# Patient Record
Sex: Female | Born: 1995 | Race: White | Hispanic: No | Marital: Single | Smoking: Former smoker
Health system: Southern US, Community
[De-identification: ages and names within clinical notes are randomized; demographics above are authoritative.]

## PROBLEM LIST (undated history)

## (undated) DIAGNOSIS — E282 Polycystic ovarian syndrome: Secondary | ICD-10-CM

## (undated) DIAGNOSIS — N926 Irregular menstruation, unspecified: Secondary | ICD-10-CM

## (undated) DIAGNOSIS — A6000 Herpesviral infection of urogenital system, unspecified: Secondary | ICD-10-CM

## (undated) HISTORY — DX: Polycystic ovarian syndrome: E28.2

## (undated) HISTORY — DX: Irregular menstruation, unspecified: N92.6

## (undated) HISTORY — DX: Herpesviral infection of urogenital system, unspecified: A60.00

## (undated) HISTORY — PX: TYMPANOSTOMY TUBE PLACEMENT: SHX32

---
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
## 2015-10-13 ENCOUNTER — Encounter: Payer: Self-pay | Admitting: Emergency Medicine

## 2015-10-13 ENCOUNTER — Ambulatory Visit
Admission: EM | Admit: 2015-10-13 | Discharge: 2015-10-13 | Disposition: A | Discharge status: Home / Self Care | Payer: Managed Care, Other (non HMO) | Attending: Family Medicine | Admitting: Family Medicine

## 2015-10-13 DIAGNOSIS — B2790 Infectious mononucleosis, unspecified without complication: Secondary | ICD-10-CM

## 2015-10-13 DIAGNOSIS — J029 Acute pharyngitis, unspecified: Secondary | ICD-10-CM | POA: Diagnosis not present

## 2015-10-13 LAB — RAPID STREP SCREEN (MED CTR MEBANE ONLY): STREPTOCOCCUS, GROUP A SCREEN (DIRECT): NEGATIVE

## 2015-10-13 LAB — MONONUCLEOSIS SCREEN: MONO SCREEN: POSITIVE — AB

## 2015-10-13 NOTE — ED Notes (Signed)
Patient c/o sore throat since Saturday.  Patient denies fevers.  

## 2015-10-13 NOTE — Discharge Instructions (Signed)
Infectious Mononucleosis °Infectious mononucleosis is an infection caused by a virus. This illness is often called "mono." It causes symptoms that affect various areas of the body, including the throat, upper air passages, and lymph glands. The liver or spleen may also be affected. °The virus spreads from person to person through close contact. The illness is usually not serious and often goes away in 2-4 weeks without treatment. In rare cases, symptoms can be more severe and last longer, sometimes up to several months. Because the illness can sometimes cause the liver or spleen to become enlarged, you should not participate in contact sports or strenuous exercise until your health care provider approves. °CAUSES  °Infectious mononucleosis is caused by the Epstein-Barr virus. This virus spreads through contact with an infected person's saliva or other bodily fluids. It is often spread through kissing. It may also spread through coughing or sharing utensils or drinking glasses that were recently used by an infected person. An infected person will not always appear ill but can still spread the virus. °RISK FACTORS °This illness is most common in adolescents and young adults. °SIGNS AND SYMPTOMS  °The most common symptoms of infectious mononucleosis are: °· Sore throat.   °· Headache.   °· Fatigue.   °· Muscle aches.   °· Swollen glands.   °· Fever.   °· Poor appetite.   °· Enlarged liver or spleen.   °Some less common symptoms that can also occur include: °· Rash. This is more common if you take antibiotic medicines. °· Feeling sick to your stomach (nauseous).   °· Abdominal pain.   °DIAGNOSIS  °Your health care provider will take your medical history and do a physical exam. Blood tests can be done to confirm the diagnosis.  °TREATMENT  °Infectious mononucleosis usually goes away on its own with time. It cannot be cured with medicines, but medicines are sometimes used to relieve symptoms. Steroid medicine is sometimes  needed if the swelling in the throat causes breathing or swallowing problems. Treatment in a hospital is sometimes needed for severe cases.  °HOME CARE INSTRUCTIONS  °· Rest as needed.   °· Do not participate in contact sports, strenuous exercise, or heavy lifting until your health care provider approves. The liver and spleen could be seriously injured if they are enlarged from the illness. You may need to wait a couple months before participating in sports.   °· Drink enough fluid to keep your urine clear or pale yellow.   °· Do not drink alcohol. °· Take medicines only as directed by your health care provider. Children under 18 years of age should not take aspirin because of the association with Reye syndrome.   °· Eat soft foods. Cold foods such as ice cream or frozen ice pops can soothe a sore throat. °· If you have a sore throat, gargle with a mixture of salt and water. This may help relieve your discomfort. Mix 1 tsp of salt in 1 cup of warm water. Sucking on hard candy may also help.   °· Start regular activities gradually after the fever is gone. Be sure to rest when tired.   °· Avoid kissing or sharing utensils or drinking glasses until your health care provider tells you that you are no longer contagious.   °PREVENTION  °To avoid spreading the virus, do not kiss anyone or share utensils, drinking glasses, or food until your health care provider tells you that you are no longer contagious. °SEEK MEDICAL CARE IF:  °· Your fever is not gone after 10 days. °· You have swollen lymph nodes that are not   back to normal after 4 weeks. °· Your activity level is not back to normal after 2 months.   °· You have yellow coloring to your eyes and skin (jaundice). °· You have constipation.   °SEEK IMMEDIATE MEDICAL CARE IF:  °· You have severe pain in the abdomen or shoulder. °· You are drooling. °· You have trouble swallowing. °· You have trouble breathing. °· You develop a stiff neck. °· You develop a severe  headache. °· You cannot stop throwing up (vomiting). °· You have convulsions. °· You are confused. °· You have trouble with balance. °· You have signs of dehydration. These may include: °¨ Weakness. °¨ Sunken eyes. °¨ Pale skin. °¨ Dry mouth. °¨ Rapid breathing or pulse. °  °This information is not intended to replace advice given to you by your health care provider. Make sure you discuss any questions you have with your health care provider. °  °Document Released: 05/14/2000 Document Revised: 06/07/2014 Document Reviewed: 01/22/2014 °Elsevier Interactive Patient Education ©2016 [employer] ° °

## 2015-10-13 NOTE — ED Provider Notes (Signed)
CSN: [id number]     Arrival date & time 10/13/15  0910 History   First MD Initiated Contact with Patient 10/13/15 1013     Chief Complaint  Patient presents with  . Sore Throat   (Consider location/radiation/quality/duration/timing/severity/associated sxs/prior Treatment) Patient is a [age] y.o. female presenting with pharyngitis. The history is provided by the patient.  Sore Throat This is a new problem. The current episode started 2 days ago. The problem occurs constantly. The problem has not changed since onset.Pertinent negatives include no shortness of breath. Associated symptoms comments: Mild runny nose, sneezing and fatigue; denies fevers, chills, swollen glands.    History reviewed. No pertinent past medical history. History reviewed. No pertinent past surgical history. History reviewed. No pertinent family history. Social History  Substance Use Topics  . Smoking status: Former Games developermoker  . Smokeless tobacco: None  . Alcohol Use: No   OB History    No data available     Review of Systems  Respiratory: Negative for shortness of breath.     Allergies  Review of patient's allergies indicates no known allergies.  Home Medications   Prior to Admission medications   Not on File   Meds Ordered and Administered this Visit  Medications - No data to display  BP 108/72 mmHg  Pulse 69  Temp(Src) 98.1 F (36.7 C) (Tympanic)  Resp 16  Ht 5\' 6"  (1.676 m)  Wt 251 lb (113.853 kg)  BMI 40.53 kg/m2  SpO2 100%  LMP 10/09/2015 (Exact Date) No data found.   Physical Exam  Constitutional: She appears well-developed and well-nourished. No distress.  HENT:  Head: Normocephalic and atraumatic.  Right Ear: Tympanic membrane, external ear and ear canal normal.  Left Ear: Tympanic membrane, external ear and ear canal normal.  Nose: No nose lacerations, sinus tenderness, nasal deformity, septal deviation or nasal septal hematoma. No epistaxis.  No foreign bodies.  Mouth/Throat:  Uvula is midline and mucous membranes are normal. Oropharyngeal exudate and posterior oropharyngeal erythema present.  Eyes: Conjunctivae and EOM are normal. Pupils are equal, round, and reactive to light. Right eye exhibits no discharge. Left eye exhibits no discharge. No scleral icterus.  Neck: Normal range of motion. Neck supple. No thyromegaly present.  Cardiovascular: Normal rate, regular rhythm and normal heart sounds.   Pulmonary/Chest: Effort normal and breath sounds normal. No respiratory distress. She has no wheezes. She has no rales.  Lymphadenopathy:    She has no cervical adenopathy.  Skin: No rash noted. She is not diaphoretic.  Nursing note and vitals reviewed.   ED Course  Procedures (including critical care time)  Labs Review Labs Reviewed  MONONUCLEOSIS SCREEN - Abnormal; Notable for the following:    Mono Screen POSITIVE (*)    All other components within normal limits  RAPID STREP SCREEN (NOT AT [facility])  CULTURE, GROUP A STREP [facility])    Imaging Review No results found.   Visual Acuity Review  Right Eye Distance:   Left Eye Distance:   Bilateral Distance:    Right Eye Near:   Left Eye Near:    Bilateral Near:         MDM   1. Mononucleosis   2. Pharyngitis     There are no discharge medications for this patient.  1. Lab results and diagnosis reviewed with patient; verbal and written information given 2. Recommend supportive treatment with salt water gargles, otc analgesics prn, increased fluids 3. Follow-up prn if symptoms worsen or don't improve  [address] Maleiya Pergola,  MD 10/13/15 1126

## 2015-10-15 LAB — CULTURE, GROUP A STREP (THRC)

## 2017-03-07 ENCOUNTER — Ambulatory Visit (INDEPENDENT_AMBULATORY_CARE_PROVIDER_SITE_OTHER): Payer: Medicaid Other | Admitting: Obstetrics and Gynecology

## 2017-03-07 VITALS — BP 108/72 | HR 71 | Ht 65.5 in | Wt 238.6 lb

## 2017-03-07 DIAGNOSIS — E669 Obesity, unspecified: Secondary | ICD-10-CM

## 2017-03-07 DIAGNOSIS — Z1389 Encounter for screening for other disorder: Secondary | ICD-10-CM

## 2017-03-07 DIAGNOSIS — Z3401 Encounter for supervision of normal first pregnancy, first trimester: Secondary | ICD-10-CM

## 2017-03-07 DIAGNOSIS — Z113 Encounter for screening for infections with a predominantly sexual mode of transmission: Secondary | ICD-10-CM

## 2017-03-07 DIAGNOSIS — N926 Irregular menstruation, unspecified: Secondary | ICD-10-CM

## 2017-03-07 DIAGNOSIS — E282 Polycystic ovarian syndrome: Secondary | ICD-10-CM

## 2017-03-07 NOTE — Progress Notes (Signed)
Cheryl Mills presents for NOB nurse interview visit. Pregnancy confirmation done at [facility]. On 02/11/2017, upt: positive. Lmp-12/31/2016 (approx). Ultrasound ordered for dating and viabiity, unsure of lmp,  hx irregular menses, and PCOS.  G-1.  P-0000. Pregnancy education material explained and given. Has just got cat in the home but has not changed litter box. Her friend is doing this for her.  NOB labs ordered. TSH/HbgA1c due to Increased BMI-38.  HIV labs and Drug screen were explained optional and she did not decline. Drug screen ordered. PNV encouraged. Genetic screening options discussed. Genetic testing: Unsure.  Pt may discuss with provider. Pt. To follow up with provider on 03/29/2017 with [facility] for NOB physical.  Pt states that she had and ultrasound several years ago and was diagnosised with PCOS. They could not locate ovary on Left side. All questions answered.

## 2017-03-07 NOTE — Patient Instructions (Signed)
Pregnancy and Zika Virus Disease Zika virus disease, or Zika, is an illness that can spread to people from mosquitoes that carry the virus. It may also spread from person to person through infected body fluids. Zika first occurred in [place], but recently it has spread to new areas. The virus occurs in tropical climates. The location of Zika continues to change. Most people who become infected with Zika virus do not develop serious illness. However, Zika may cause birth defects in an unborn baby whose mother is infected with the virus. It may also increase the risk of miscarriage. What are the symptoms of Zika virus disease? In many cases, people who have been infected with Zika virus do not develop any symptoms. If symptoms appear, they usually start about a week after the person is infected. Symptoms are usually mild. They may include:  Fever.  Rash.  Red eyes.  Joint pain.  How does Zika virus disease spread? The main way that Zika virus spreads is through the bite of a certain type of mosquito. Unlike most types of mosquitos, which bite only at night, the type of mosquito that carries Zika virus bites both at night and during the day. Zika virus can also spread through sexual contact, through a blood transfusion, and from a mother to her baby before or during birth. Once you have had Zika virus disease, it is unlikely that you will get it again. Can I pass Zika to my baby during pregnancy? Yes, Zika can pass from a mother to her baby before or during birth. What problems can Zika cause for my baby? A woman who is infected with Zika virus while pregnant is at risk of having her baby born with a condition in which the brain or head is smaller than expected (microcephaly). Babies who have microcephaly can have developmental delays, seizures, hearing problems, and vision problems. Having Zika virus disease during pregnancy can also increase the risk of miscarriage. How can Zika virus disease be  prevented? There is no vaccine to prevent Zika. The best way to prevent the disease is to avoid infected mosquitoes and avoid exposure to body fluids that can spread the virus. Avoid any possible exposure to Zika by taking the following precautions. For women and their sex partners:  Avoid traveling to high-risk areas. The locations where Zika is being reported change often. To identify high-risk areas, check the CDC travel website: www.cdc.gov/zika/geo/index.html  If you or your sex partner must travel to a high-risk area, talk with a health care provider before and after traveling.  Take all precautions to avoid mosquito bites if you live in, or travel to, any of the high-risk areas. Insect repellents are safe to use during pregnancy.  Ask your health care provider when it is safe to have sexual contact.  For women:  If you are pregnant or trying to become pregnant, avoid sexual contact with persons who may have been exposed to Zika virus, persons who have possible symptoms of Zika, or persons whose history you are unsure about. If you choose to have sexual contact with someone who may have been exposed to Zika virus, use condoms correctly during the entire duration of sexual activity, every time. Do not share sexual devices, as you may be exposed to body fluids.  Ask your health care provider about when it is safe to attempt pregnancy after a possible exposure to Zika virus.  What steps should I take to avoid mosquito bites? Take these steps to avoid mosquito bites   when you are in a high-risk area:  Wear loose clothing that covers your arms and legs.  Limit your outdoor activities.  Do not open windows unless they have window screens.  Sleep under mosquito nets.  Use insect repellent. The best insect repellents have:  DEET, picaridin, oil of lemon eucalyptus (OLE), or IR3535 in them.  Higher amounts of an active ingredient in them.  Remember that insect repellents are safe to  use during pregnancy.  Do not use OLE on children who are younger than 3 years of age. Do not use insect repellent on babies who are younger than 2 months of age.  Cover your child's stroller with mosquito netting. Make sure the netting fits snugly and that any loose netting does not cover your child's mouth or nose. Do not use a blanket as a mosquito-protection cover.  Do not apply insect repellent underneath clothing.  If you are using sunscreen, apply the sunscreen before applying the insect repellent.  Treat clothing with permethrin. Do not apply permethrin directly to your skin. Follow label directions for safe use.  Get rid of standing water, where mosquitoes may reproduce. Standing water is often found in items such as buckets, bowls, animal food dishes, and flowerpots.  When you return from traveling to any high-risk area, continue taking actions to protect yourself against mosquito bites for 3 weeks, even if you show no signs of illness. This will prevent spreading Zika virus to uninfected mosquitoes. What should I know about the sexual transmission of Zika? People can spread Zika to their sexual partners during vaginal, anal, or oral sex, or by sharing sexual devices. Many people with Zika do not develop symptoms, so a person could spread the disease without knowing that they are infected. The greatest risk is to women who are pregnant or who may become pregnant. Zika virus can live longer in semen than it can live in blood. Couples can prevent sexual transmission of the virus by:  Using condoms correctly during the entire duration of sexual activity, every time. This includes vaginal, anal, and oral sex.  Not sharing sexual devices. Sharing increases your risk of being exposed to body fluid from another person.  Avoiding all sexual activity until your health care provider says it is safe.  Should I be tested for Zika virus? A sample of your blood can be tested for Zika virus. A  pregnant woman should be tested if she may have been exposed to the virus or if she has symptoms of Zika. She may also have additional tests done during her pregnancy, such ultrasound testing. Talk with your health care provider about which tests are recommended. This information is not intended to replace advice given to you by your health care provider. Make sure you discuss any questions you have with your health care provider. Document Released: 02/05/2015 Document Revised: 10/23/2015 Document Reviewed: 01/29/2015 Elsevier Interactive Patient Education  2018 [employer] Pregnancy and Toxoplasmosis Toxoplasmosis is an infection that is caused by a parasite. Usually, there are no symptoms and the body can fight off the infection. If you get toxoplasmosis during pregnancy, there is a chance that the infection will spread to your baby. If this happens, your baby may develop serious health problems, such as blindness, intellectual disabilities, and other neurological disorders. Some of these problems may not show up for years. How do people get toxoplasmosis? You can get toxoplasmosis if:  You touch anything that is contaminated with infected cat feces and then you touch your   mouth.  You eat raw or undercooked meat from an infected animal.  You eat fruits and vegetables that were grown in contaminated soil.  Your baby can get toxoplasmosis through your blood supply if you are infected during pregnancy or just before pregnancy. How can I protect myself and my baby against toxoplasmosis?  Do not get a new cat.  Do not touch stray cats.  If you have a sandbox, cover it when it is not being used.  Avoid working in soil where cats may leave feces.  Wear gloves when you work in the soil. Wash your hands with soap and water when you are finished.  If you have a cat: ? Have someone else change the cat's litter box daily. He or she should wash his or her hands afterward. ? Do not let your cat  outside. ? Do not feed your cat any raw meat.  Do not eat undercooked meat, especially meat that has never been frozen.  Wash and peel all fruits and vegetables before eating them.  Avoid drinking untreated water.  If you have toxoplasmosis and you are not pregnant, wait at least 6 months before becoming pregnant. How do I know if I have toxoplasmosis? The only way to know for sure that you have toxoplasmosis is with a test. People with toxoplasmosis do not always have symptoms. If symptoms are present, they may include:  A fever.  Swollen glands.  Muscle aches.  Headaches.  Feeling like you have a cold or the flu.  If you think you have toxoplasmosis, or if you think that you may have been exposed to it, call your health care provider. How is toxoplasmosis diagnosed? When you become pregnant, your health care provider may order a blood test to check whether you have ever had toxoplasmosis.  If you have had toxoplasmosis infection before, you cannot get it again.  If you have never had toxoplasmosis, your health care provider may repeat this test at a later date.  If you become infected during pregnancy, your health care provider may do more tests to find out whether the infection has spread to the baby.  Other tests may include an ultrasound and a test of your amniotic fluid (amniocentesis).  How is toxoplasmosis treated? Toxoplasmosis may be treated with antibiotics and other medicines.  Some of these medicines can lower your baby's chance of developing complications later on.  Medicines may need to be taken for up to one year.  What should I do at home if I am diagnosed with toxoplasmosis?  Take over-the-counter and prescription medicines only as told by your health care provider.  If you were prescribed an antibiotic medicine, take it as told by your health care provider. Do not stop taking the antibiotic even if you start to feel better.  Keep all follow-up visits  as told by your health care provider. This is important. This information is not intended to replace advice given to you by your health care provider. Make sure you discuss any questions you have with your health care provider. Document Released: 08/23/2000 Document Revised: 01/15/2016 Document Reviewed: 12/21/2015 Elsevier Interactive Patient Education  2018 [employer] Hyperemesis Gravidarum Hyperemesis gravidarum is a severe form of nausea and vomiting that happens during pregnancy. Hyperemesis is worse than morning sickness. It may cause you to have nausea or vomiting all day for many days. It may keep you from eating and drinking enough food and liquids. Hyperemesis usually occurs during the first half (the first 20   weeks) of pregnancy. It often goes away once a woman is in her second half of pregnancy. However, sometimes hyperemesis continues through an entire pregnancy. What are the causes? The cause of this condition is not known. It may be related to changes in chemicals (hormones) in the body during pregnancy, such as the high level of pregnancy hormone (human chorionic gonadotropin) or the increase in the female sex hormone (estrogen). What are the signs or symptoms? Symptoms of this condition include:  Severe nausea and vomiting.  Nausea that does not go away.  Vomiting that does not allow you to keep any food down.  Weight loss.  Body fluid loss (dehydration).  Having no desire to eat, or not liking food that you have previously enjoyed.  How is this diagnosed? This condition may be diagnosed based on:  A physical exam.  Your medical history.  Your symptoms.  Blood tests.  Urine tests.  How is this treated? This condition may be managed with medicine. If medicines to do not help relieve nausea and vomiting, you may need to receive fluids through an IV tube at the hospital. Follow these instructions at home:  Take over-the-counter and prescription medicines only  as told by your health care provider.  Avoid iron pills and multivitamins that contain iron for the first 3-4 months of pregnancy. If you take prescription iron pills, do not stop taking them unless your health care provider approves.  Take the following actions to help prevent nausea and vomiting: ? In the morning, before getting out of bed, try eating a couple of dry crackers or a piece of toast. ? Avoid foods and smells that upset your stomach. Fatty and spicy foods may make nausea worse. ? Eat 5-6 small meals a day. ? Do not drink fluids while eating meals. Drink between meals. ? Eat or suck on things that have ginger in them. Ginger can help relieve nausea. ? Avoid food preparation. The smell of food can spoil your appetite or trigger nausea.  Follow instructions from your health care provider about eating or drinking restrictions.  For snacks, eat high-protein foods, such as cheese.  Keep all follow-up and pre-birth (prenatal) visits as told by your health care provider. This is important. Contact a health care provider if:  You have pain in your abdomen.  You have a severe headache.  You have vision problems.  You are losing weight. Get help right away if:  You cannot drink fluids without vomiting.  You vomit blood.  You have constant nausea and vomiting.  You are very weak.  You are very thirsty.  You feel dizzy.  You faint.  You have a fever or other symptoms that last for more than 2-3 days.  You have a fever and your symptoms suddenly get worse. Summary  Hyperemesis gravidarum is a severe form of nausea and vomiting that happens during pregnancy.  Making some changes to your eating habits may help relieve nausea and vomiting.  This condition may be managed with medicine.  If medicines to do not help relieve nausea and vomiting, you may need to receive fluids through an IV tube at the hospital. This information is not intended to replace advice given to  you by your health care provider. Make sure you discuss any questions you have with your health care provider. Document Released: 05/17/2005 Document Revised: 01/14/2016 Document Reviewed: 01/14/2016 Elsevier Interactive Patient Education  2017 [employer] First Trimester of Pregnancy The first trimester of pregnancy is from week   1 until the end of week 13 (months 1 through 3). During this time, your baby will begin to develop inside you. At 6-8 weeks, the eyes and face are formed, and the heartbeat can be seen on ultrasound. At the end of 12 weeks, all the baby's organs are formed. Prenatal care is all the medical care you receive before the birth of your baby. Make sure you get good prenatal care and follow all of your doctor's instructions. Follow these instructions at home: Medicines  Take over-the-counter and prescription medicines only as told by your doctor. Some medicines are safe and some medicines are not safe during pregnancy.  Take a prenatal vitamin that contains at least 600 micrograms (mcg) of folic acid.  If you have trouble pooping (constipation), take medicine that will make your stool soft (stool softener) if your doctor approves. Eating and drinking  Eat regular, healthy meals.  Your doctor will tell you the amount of weight gain that is right for you.  Avoid raw meat and uncooked cheese.  If you feel sick to your stomach (nauseous) or throw up (vomit): ? Eat 4 or 5 small meals a day instead of 3 large meals. ? Try eating a few soda crackers. ? Drink liquids between meals instead of during meals.  To prevent constipation: ? Eat foods that are high in fiber, like fresh fruits and vegetables, whole grains, and beans. ? Drink enough fluids to keep your pee (urine) clear or pale yellow. Activity  Exercise only as told by your doctor. Stop exercising if you have cramps or pain in your lower belly (abdomen) or low back.  Do not exercise if it is too hot, too humid,  or if you are in a place of great height (high altitude).  Try to avoid standing for long periods of time. Move your legs often if you must stand in one place for a long time.  Avoid heavy lifting.  Wear low-heeled shoes. Sit and stand up straight.  You can have sex unless your doctor tells you not to. Relieving pain and discomfort  Wear a good support bra if your breasts are sore.  Take warm water baths (sitz baths) to soothe pain or discomfort caused by hemorrhoids. Use hemorrhoid cream if your doctor says it is okay.  Rest with your legs raised if you have leg cramps or low back pain.  If you have puffy, bulging veins (varicose veins) in your legs: ? Wear support hose or compression stockings as told by your doctor. ? Raise (elevate) your feet for 15 minutes, 3-4 times a day. ? Limit salt in your food. Prenatal care  Schedule your prenatal visits by the twelfth week of pregnancy.  Write down your questions. Take them to your prenatal visits.  Keep all your prenatal visits as told by your doctor. This is important. Safety  Wear your seat belt at all times when driving.  Make a list of emergency phone numbers. The list should include numbers for family, friends, the hospital, and police and fire departments. General instructions  Ask your doctor for a referral to a local prenatal class. Begin classes no later than at the start of month 6 of your pregnancy.  Ask for help if you need counseling or if you need help with nutrition. Your doctor can give you advice or tell you where to go for help.  Do not use hot tubs, steam rooms, or saunas.  Do not douche or use tampons or scented sanitary pads.    Do not cross your legs for long periods of time.  Avoid all herbs and alcohol. Avoid drugs that are not approved by your doctor.  Do not use any tobacco products, including cigarettes, chewing tobacco, and electronic cigarettes. If you need help quitting, ask your doctor. You may  get counseling or other support to help you quit.  Avoid cat litter boxes and soil used by cats. These carry germs that can cause birth defects in the baby and can cause a loss of your baby (miscarriage) or stillbirth.  Visit your dentist. At home, brush your teeth with a soft toothbrush. Be gentle when you floss. Contact a doctor if:  You are dizzy.  You have mild cramps or pressure in your lower belly.  You have a nagging pain in your belly area.  You continue to feel sick to your stomach, you throw up, or you have watery poop (diarrhea).  You have a bad smelling fluid coming from your vagina.  You have pain when you pee (urinate).  You have increased puffiness (swelling) in your face, hands, legs, or ankles. Get help right away if:  You have a fever.  You are leaking fluid from your vagina.  You have spotting or bleeding from your vagina.  You have very bad belly cramping or pain.  You gain or lose weight rapidly.  You throw up blood. It may look like coffee grounds.  You are around people who have German measles, fifth disease, or chickenpox.  You have a very bad headache.  You have shortness of breath.  You have any kind of trauma, such as from a fall or a car accident. Summary  The first trimester of pregnancy is from week 1 until the end of week 13 (months 1 through 3).  To take care of yourself and your unborn baby, you will need to eat healthy meals, take medicines only if your doctor tells you to do so, and do activities that are safe for you and your baby.  Keep all follow-up visits as told by your doctor. This is important as your doctor will have to ensure that your baby is healthy and growing well. This information is not intended to replace advice given to you by your health care provider. Make sure you discuss any questions you have with your health care provider. Document Released: 11/03/2007 Document Revised: 05/25/2016 Document Reviewed:  05/25/2016 Else[employer] Interactive Patient Education  2017 [employer] Commonly Asked Questions During Pregnancy  Cats: A parasite can be excreted in cat feces.  To avoid exposure you need to have another person empty the little box.  If you must empty the litter box you will need to wear gloves.  Wash your hands after handling your cat.  This parasite can also be found in raw or undercooked meat so this should also be avoided.  Colds, Sore Throats, Flu: Please check your medication sheet to see what you can take for symptoms.  If your symptoms are unrelieved by these medications please call the office.  Dental Work: Most any dental work your dentist recommends is permitted.  X-rays should only be taken during the first trimester if absolutely necessary.  Your abdomen should be shielded with a lead apron during all x-rays.  Please notify your provider prior to receiving any x-rays.  Novocaine is fine; gas is not recommended.  If your dentist requires a note from us prior to dental work please call the office and we will provide one for you.    Exercise: Exercise is an important part of staying healthy during your pregnancy.  You may continue most exercises you were accustomed to prior to pregnancy.  Later in your pregnancy you will most likely notice you have difficulty with activities requiring balance like riding a bicycle.  It is important that you listen to your body and avoid activities that put you at a higher risk of falling.  Adequate rest and staying well hydrated are a must!  If you have questions about the safety of specific activities ask your provider.    Exposure to Children with illness: Try to avoid obvious exposure; report any symptoms to us when noted,  If you have chicken pos, red measles or mumps, you should be immune to these diseases.   Please do not take any vaccines while pregnant unless you have checked with your OB provider.  Fetal Movement: After 28 weeks we recommend you do  "kick counts" twice daily.  Lie or sit down in a calm quiet environment and count your baby movements "kicks".  You should feel your baby at least 10 times per hour.  If you have not felt 10 kicks within the first hour get up, walk around and have something sweet to eat or drink then repeat for an additional hour.  If count remains less than 10 per hour notify your provider.  Fumigating: Follow your pest control agent's advice as to how long to stay out of your home.  Ventilate the area well before re-entering.  Hemorrhoids:   Most over-the-counter preparations can be used during pregnancy.  Check your medication to see what is safe to use.  It is important to use a stool softener or fiber in your diet and to drink lots of liquids.  If hemorrhoids seem to be getting worse please call the office.   Hot Tubs:  Hot tubs Jacuzzis and saunas are not recommended while pregnant.  These increase your internal body temperature and should be avoided.  Intercourse:  Sexual intercourse is safe during pregnancy as long as you are comfortable, unless otherwise advised by your provider.  Spotting may occur after intercourse; report any bright red bleeding that is heavier than spotting.  Labor:  If you know that you are in labor, please go to the hospital.  If you are unsure, please call the office and let us help you decide what to do.  Lifting, straining, etc:  If your job requires heavy lifting or straining please check with your provider for any limitations.  Generally, you should not lift items heavier than that you can lift simply with your hands and arms (no back muscles)  Painting:  Paint fumes do not harm your pregnancy, but may make you ill and should be avoided if possible.  Latex or water based paints have less odor than oils.  Use adequate ventilation while painting.  Permanents & Hair Color:  Chemicals in hair dyes are not recommended as they cause increase hair dryness which can increase hair loss  during pregnancy.  " Highlighting" and permanents are allowed.  Dye may be absorbed differently and permanents may not hold as well during pregnancy.  Sunbathing:  Use a sunscreen, as skin burns easily during pregnancy.  Drink plenty of fluids; avoid over heating.  Tanning Beds:  Because their possible side effects are still unknown, tanning beds are not recommended.  Ultrasound Scans:  Routine ultrasounds are performed at approximately 20 weeks.  You will be able to see your baby's general anatomy an   if you would like to know the gender this can usually be determined as well.  If it is questionable when you conceived you may also receive an ultrasound early in your pregnancy for dating purposes.  Otherwise ultrasound exams are not routinely performed unless there is a medical necessity.  Although you can request a scan we ask that you pay for it when conducted because insurance does not cover " patient request" scans.  Work: If your pregnancy proceeds without complications you may work until your due date, unless your physician or employer advises otherwise.  Round Ligament Pain/Pelvic Discomfort:  Sharp, shooting pains not associated with bleeding are fairly common, usually occurring in the second trimester of pregnancy.  They tend to be worse when standing up or when you remain standing for long periods of time.  These are the result of pressure of certain pelvic ligaments called "round ligaments".  Rest, Tylenol and heat seem to be the most effective relief.  As the womb and fetus grow, they rise out of the pelvis and the discomfort improves.  Please notify the office if your pain seems different than that described.  It may represent a more serious condition.   

## 2017-03-08 LAB — MICROSCOPIC EXAMINATION
CASTS: NONE SEEN /LPF
RBC MICROSCOPIC, UA: NONE SEEN /HPF (ref 0–?)

## 2017-03-08 LAB — URINALYSIS, ROUTINE W REFLEX MICROSCOPIC
Bilirubin, UA: NEGATIVE
GLUCOSE, UA: NEGATIVE
KETONES UA: NEGATIVE
NITRITE UA: NEGATIVE
Protein, UA: NEGATIVE
RBC UA: NEGATIVE
Specific Gravity, UA: 1.024 (ref 1.005–1.030)
Urobilinogen, Ur: 0.2 mg/dL (ref 0.2–1.0)
pH, UA: 6.5 (ref 5.0–7.5)

## 2017-03-08 LAB — NICOTINE SCREEN, URINE: Cotinine Ql Scrn, Ur: NEGATIVE ng/mL

## 2017-03-08 LAB — MONITOR DRUG PROFILE 14(MW)
AMPHETAMINE SCREEN URINE: NEGATIVE ng/mL
BARBITURATE SCREEN URINE: NEGATIVE ng/mL
BENZODIAZEPINE SCREEN, URINE: NEGATIVE ng/mL
BUPRENORPHINE, URINE: NEGATIVE ng/mL
CANNABINOIDS UR QL SCN: NEGATIVE ng/mL
COCAINE(METAB.)SCREEN, URINE: NEGATIVE ng/mL
Creatinine(Crt), U: 189.9 mg/dL (ref 20.0–300.0)
FENTANYL, URINE: NEGATIVE pg/mL
MEPERIDINE SCREEN, URINE: NEGATIVE ng/mL
Methadone Screen, Urine: NEGATIVE ng/mL
OPIATE SCREEN URINE: NEGATIVE ng/mL
OXYCODONE+OXYMORPHONE UR QL SCN: NEGATIVE ng/mL
PHENCYCLIDINE QUANTITATIVE URINE: NEGATIVE ng/mL
PROPOXYPHENE SCREEN URINE: NEGATIVE ng/mL
Ph of Urine: 6.2 (ref 4.5–8.9)
SPECIFIC GRAVITY: 1.032
TRAMADOL SCREEN, URINE: NEGATIVE ng/mL

## 2017-03-09 ENCOUNTER — Encounter: Payer: Self-pay | Admitting: Obstetrics and Gynecology

## 2017-03-09 ENCOUNTER — Other Ambulatory Visit: Payer: Self-pay | Admitting: Obstetrics and Gynecology

## 2017-03-09 DIAGNOSIS — A749 Chlamydial infection, unspecified: Secondary | ICD-10-CM

## 2017-03-09 DIAGNOSIS — O26899 Other specified pregnancy related conditions, unspecified trimester: Secondary | ICD-10-CM | POA: Insufficient documentation

## 2017-03-09 DIAGNOSIS — Z8619 Personal history of other infectious and parasitic diseases: Secondary | ICD-10-CM | POA: Insufficient documentation

## 2017-03-09 DIAGNOSIS — Z6791 Unspecified blood type, Rh negative: Secondary | ICD-10-CM

## 2017-03-09 LAB — CBC WITH DIFFERENTIAL/PLATELET
BASOS: 0 %
Basophils Absolute: 0 10*3/uL (ref 0.0–0.2)
EOS (ABSOLUTE): 0.1 10*3/uL (ref 0.0–0.4)
EOS: 1 %
HEMATOCRIT: 39.6 % (ref 34.0–46.6)
HEMOGLOBIN: 13.5 g/dL (ref 11.1–15.9)
Immature Grans (Abs): 0 10*3/uL (ref 0.0–0.1)
Immature Granulocytes: 0 %
LYMPHS ABS: 1.5 10*3/uL (ref 0.7–3.1)
Lymphs: 16 %
MCH: 29.5 pg (ref 26.6–33.0)
MCHC: 34.1 g/dL (ref 31.5–35.7)
MCV: 87 fL (ref 79–97)
MONOCYTES: 6 %
Monocytes Absolute: 0.5 10*3/uL (ref 0.1–0.9)
NEUTROS ABS: 7.2 10*3/uL — AB (ref 1.4–7.0)
Neutrophils: 77 %
Platelets: 283 10*3/uL (ref 150–379)
RBC: 4.58 x10E6/uL (ref 3.77–5.28)
RDW: 14.1 % (ref 12.3–15.4)
WBC: 9.4 10*3/uL (ref 3.4–10.8)

## 2017-03-09 LAB — HEMOGLOBIN A1C
ESTIMATED AVERAGE GLUCOSE: 103 mg/dL
HEMOGLOBIN A1C: 5.2 % (ref 4.8–5.6)

## 2017-03-09 LAB — GC/CHLAMYDIA PROBE AMP
Chlamydia trachomatis, NAA: POSITIVE — AB
Neisseria gonorrhoeae by PCR: NEGATIVE

## 2017-03-09 LAB — ABO

## 2017-03-09 LAB — RUBELLA SCREEN: RUBELLA: 1.32 {index} (ref >0.99)

## 2017-03-09 LAB — VARICELLA ZOSTER ANTIBODY, IGG: VARICELLA: 184 {index} (ref >165)

## 2017-03-09 LAB — HIV ANTIBODY (ROUTINE TESTING W REFLEX): HIV Screen 4th Generation wRfx: NONREACTIVE

## 2017-03-09 LAB — RH TYPE: RH TYPE: NEGATIVE

## 2017-03-09 LAB — TSH: TSH: 2.6 u[IU]/mL (ref 0.450–4.500)

## 2017-03-09 LAB — CULTURE, OB URINE

## 2017-03-09 LAB — RPR: RPR: NONREACTIVE

## 2017-03-09 LAB — ANTIBODY SCREEN: Antibody Screen: NEGATIVE

## 2017-03-09 LAB — HEPATITIS B SURFACE ANTIGEN: Hepatitis B Surface Ag: NEGATIVE

## 2017-03-09 LAB — URINE CULTURE, OB REFLEX

## 2017-03-09 MED ORDER — FLUCONAZOLE 100 MG PO TABS: 100.0000 mg | ORAL_TABLET | Freq: Every day | ORAL | 0 refills | Status: DC

## 2017-03-09 MED ORDER — AZITHROMYCIN 500 MG PO TABS: 1000.0000 mg | ORAL_TABLET | Freq: Once | ORAL | 1 refills | Status: AC

## 2017-03-18 ENCOUNTER — Ambulatory Visit (INDEPENDENT_AMBULATORY_CARE_PROVIDER_SITE_OTHER): Payer: Medicaid Other

## 2017-03-18 DIAGNOSIS — E282 Polycystic ovarian syndrome: Secondary | ICD-10-CM | POA: Diagnosis not present

## 2017-03-18 DIAGNOSIS — N926 Irregular menstruation, unspecified: Secondary | ICD-10-CM | POA: Diagnosis not present

## 2017-03-18 DIAGNOSIS — Z3401 Encounter for supervision of normal first pregnancy, first trimester: Secondary | ICD-10-CM

## 2017-03-29 ENCOUNTER — Encounter: Payer: Self-pay | Admitting: Obstetrics and Gynecology

## 2017-03-30 ENCOUNTER — Ambulatory Visit (INDEPENDENT_AMBULATORY_CARE_PROVIDER_SITE_OTHER): Payer: Medicaid Other | Admitting: Obstetrics and Gynecology

## 2017-03-30 ENCOUNTER — Encounter: Payer: Self-pay | Admitting: Obstetrics and Gynecology

## 2017-03-30 VITALS — BP 104/72 | HR 87 | Wt 240.0 lb

## 2017-03-30 DIAGNOSIS — Z3492 Encounter for supervision of normal pregnancy, unspecified, second trimester: Secondary | ICD-10-CM

## 2017-03-30 DIAGNOSIS — Z6841 Body Mass Index (BMI) 40.0 and over, adult: Secondary | ICD-10-CM | POA: Insufficient documentation

## 2017-03-30 DIAGNOSIS — E669 Obesity, unspecified: Secondary | ICD-10-CM

## 2017-03-30 LAB — POCT URINALYSIS DIPSTICK
BILIRUBIN UA: NEGATIVE
Blood, UA: NEGATIVE
GLUCOSE UA: NEGATIVE
Ketones, UA: NEGATIVE
LEUKOCYTES UA: NEGATIVE
NITRITE UA: NEGATIVE
Protein, UA: NEGATIVE
Spec Grav, UA: 1.01 (ref 1.010–1.025)
Urobilinogen, UA: 0.2 E.U./dL
pH, UA: 6.5 (ref 5.0–8.0)

## 2017-03-30 MED ORDER — AZITHROMYCIN 500 MG PO TABS: 1000.0000 mg | ORAL_TABLET | Freq: Once | ORAL | 1 refills | Status: AC

## 2017-03-30 NOTE — Patient Instructions (Signed)
Rh Incompatibility Rh incompatibility is a condition that occurs during pregnancy if a woman has Rh-negative blood and her baby has Rh-positive blood. "Rh-negative" and "Rh-positive" refer to whether or not the blood has an Rh factor. An Rh factor is a specific protein found on the surface of red blood cells. If a woman has Rh factor, she is Rh-positive. If she does not have an Rh factor, she is Rh-negative. Having or not having an Rh factor does not affect the mother's general health. However, it can cause problems during pregnancy. What kind of problems can Rh incompatibility cause? During pregnancy, blood from the baby can cross into the mother's bloodstream, especially during delivery. If a mother is Rh-negative and the baby is Rh-positive, the mother's defense system will react to the baby's blood as if it was a foreign substance and will create proteins (antibodies). This is called sensitization. Once the mother is sensitized, her Rh antibodies will cross the placenta to the baby and attack the baby's Rh-positive blood as if it is a harmful substance. Rh incompatibility can also happen if the Rh-negative pregnant woman is exposed to the Rh factor during a blood transfusion with Rh-positive blood. How does this condition affect my baby? The Rh antibodies that attack and destroy the baby's red blood cells can lead to hemolytic disease in the baby. Hemolytic disease is when the red blood cells break down. This can cause:  Yellowing of the skin and eyes (jaundice).  The body to not have enough healthy red blood cells (anemia).  Brain damage.  Heart failure.  Death.  These antibodies usually do not cause problems during a first pregnancy. This is because the blood from the baby often times crosses into the mother's bloodstream during delivery, and the baby is born before many of the antibodies can develop. However, the antibodies stay in your body once they have formed. Because of this, Rh  incompatibility is more likely to cause problems in second or later pregnancies (if the baby is Rh-positive). How is this diagnosed? When a woman becomes pregnant, blood tests may be done to find out her blood type and Rh factor. If the woman is Rh-negative, she also may have another blood test called an antibody screen. The antibody screen shows whether she has Rh antibodies in her blood. If she does, it means she was exposed to Rh-positive blood before, and she is at risk for Rh incompatibility. To find out whether the baby is developing hemolytic anemia and how serious it is, caregivers may use more advanced tests, such as ultrasonography (commonly known as ultrasound). How is Rh incompatibility treated? Rh incompatibility is treated with a shot of medicine called Rho (D) immune globulin. This medicine keeps the woman's body from making antibodies that can cause serious problems in the baby or future babies. Two shots will be given, one at around your seventh month of pregnancy and the other within 72 hours of your baby being born. If you are Rh-negative, you will need this medicine every time you have a baby with Rh-positive blood. If you already have antibodies in your blood, Rho (D) immune globulin will not help. Your doctor will not give you this medicine, but will watch your pregnancy closely for problems instead. This shot may also be given to an Rh-negative woman when the risk of blood transfer between the mom and baby is high. The risk is high with:  An amniocentesis.  A miscarriage or an abortion.  An ectopic pregnancy.  Any   vaginal bleeding during pregnancy.  This information is not intended to replace advice given to you by your health care provider. Make sure you discuss any questions you have with your health care provider. Document Released: 11/06/2001 Document Revised: 10/23/2015 Document Reviewed: 08/29/2012 Elsevier Interactive Patient Education  2017 [employer] Second  Trimester of Pregnancy The second trimester is from week 14 through week 27 (months 4 through 6). The second trimester is often a time when you feel your best. Your body has adjusted to being pregnant, and you begin to feel better physically. Usually, morning sickness has lessened or quit completely, you may have more energy, and you may have an increase in appetite. The second trimester is also a time when the fetus is growing rapidly. At the end of the sixth month, the fetus is about 9 inches long and weighs about 1 pounds. You will likely begin to feel the baby move (quickening) between 16 and 20 weeks of pregnancy. Body changes during your second trimester Your body continues to go through many changes during your second trimester. The changes vary from woman to woman.  Your weight will continue to increase. You will notice your lower abdomen bulging out.  You may begin to get stretch marks on your hips, abdomen, and breasts.  You may develop headaches that can be relieved by medicines. The medicines should be approved by your health care provider.  You may urinate more often because the fetus is pressing on your bladder.  You may develop or continue to have heartburn as a result of your pregnancy.  You may develop constipation because certain hormones are causing the muscles that push waste through your intestines to slow down.  You may develop hemorrhoids or swollen, bulging veins (varicose veins).  You may have back pain. This is caused by: ? Weight gain. ? Pregnancy hormones that are relaxing the joints in your pelvis. ? A shift in weight and the muscles that support your balance.  Your breasts will continue to grow and they will continue to become tender.  Your gums may bleed and may be sensitive to brushing and flossing.  Dark spots or blotches (chloasma, mask of pregnancy) may develop on your face. This will likely fade after the baby is born.  A dark line from your belly  button to the pubic area (linea nigra) may appear. This will likely fade after the baby is born.  You may have changes in your hair. These can include thickening of your hair, rapid growth, and changes in texture. Some women also have hair loss during or after pregnancy, or hair that feels dry or thin. Your hair will most likely return to normal after your baby is born.  What to expect at prenatal visits During a routine prenatal visit:  You will be weighed to make sure you and the fetus are growing normally.  Your blood pressure will be taken.  Your abdomen will be measured to track your baby's growth.  The fetal heartbeat will be listened to.  Any test results from the previous visit will be discussed.  Your health care provider may ask you:  How you are feeling.  If you are feeling the baby move.  If you have had any abnormal symptoms, such as leaking fluid, bleeding, severe headaches, or abdominal cramping.  If you are using any tobacco products, including cigarettes, chewing tobacco, and electronic cigarettes.  If you have any questions.  Other tests that may be performed during your second trimester  include:  Blood tests that check for: ? Low iron levels (anemia). ? High blood sugar that affects pregnant women (gestational diabetes) between 5 and 28 weeks. ? Rh antibodies. This is to check for a protein on red blood cells (Rh factor).  Urine tests to check for infections, diabetes, or protein in the urine.  An ultrasound to confirm the proper growth and development of the baby.  An amniocentesis to check for possible genetic problems.  Fetal screens for spina bifida and Down syndrome.  HIV (human immunodeficiency virus) testing. Routine prenatal testing includes screening for HIV, unless you choose not to have this test.  Follow these instructions at home: Medicines  Follow your health care provider's instructions regarding medicine use. Specific medicines may  be either safe or unsafe to take during pregnancy.  Take a prenatal vitamin that contains at least 600 micrograms (mcg) of folic acid.  If you develop constipation, try taking a stool softener if your health care provider approves. Eating and drinking  Eat a balanced diet that includes fresh fruits and vegetables, whole grains, good sources of protein such as meat, eggs, or tofu, and low-fat dairy. Your health care provider will help you determine the amount of weight gain that is right for you.  Avoid raw meat and uncooked cheese. These carry germs that can cause birth defects in the baby.  If you have low calcium intake from food, talk to your health care provider about whether you should take a daily calcium supplement.  Limit foods that are high in fat and processed sugars, such as fried and sweet foods.  To prevent constipation: ? Drink enough fluid to keep your urine clear or pale yellow. ? Eat foods that are high in fiber, such as fresh fruits and vegetables, whole grains, and beans. Activity  Exercise only as directed by your health care provider. Most women can continue their usual exercise routine during pregnancy. Try to exercise for 30 minutes at least 5 days a week. Stop exercising if you experience uterine contractions.  Avoid heavy lifting, wear low heel shoes, and practice good posture.  A sexual relationship may be continued unless your health care provider directs you otherwise. Relieving pain and discomfort  Wear a good support bra to prevent discomfort from breast tenderness.  Take warm sitz baths to soothe any pain or discomfort caused by hemorrhoids. Use hemorrhoid cream if your health care provider approves.  Rest with your legs elevated if you have leg cramps or low back pain.  If you develop varicose veins, wear support hose. Elevate your feet for 15 minutes, 3-4 times a day. Limit salt in your diet. Prenatal Care  Write down your questions. Take them to  your prenatal visits.  Keep all your prenatal visits as told by your health care provider. This is important. Safety  Wear your seat belt at all times when driving.  Make a list of emergency phone numbers, including numbers for family, friends, the hospital, and police and fire departments. General instructions  Ask your health care provider for a referral to a local prenatal education class. Begin classes no later than the beginning of month 6 of your pregnancy.  Ask for help if you have counseling or nutritional needs during pregnancy. Your health care provider can offer advice or refer you to specialists for help with various needs.  Do not use hot tubs, steam rooms, or saunas.  Do not douche or use tampons or scented sanitary pads.  Do not cross  your legs for long periods of time.  Avoid cat litter boxes and soil used by cats. These carry germs that can cause birth defects in the baby and possibly loss of the fetus by miscarriage or stillbirth.  Avoid all smoking, herbs, alcohol, and unprescribed drugs. Chemicals in these products can affect the formation and growth of the baby.  Do not use any products that contain nicotine or tobacco, such as cigarettes and e-cigarettes. If you need help quitting, ask your health care provider.  Visit your dentist if you have not gone yet during your pregnancy. Use a soft toothbrush to brush your teeth and be gentle when you floss. Contact a health care provider if:  You have dizziness.  You have mild pelvic cramps, pelvic pressure, or nagging pain in the abdominal area.  You have persistent nausea, vomiting, or diarrhea.  You have a bad smelling vaginal discharge.  You have pain when you urinate. Get help right away if:  You have a fever.  You are leaking fluid from your vagina.  You have spotting or bleeding from your vagina.  You have severe abdominal cramping or pain.  You have rapid weight gain or weight loss.  You have  shortness of breath with chest pain.  You notice sudden or extreme swelling of your face, hands, ankles, feet, or legs.  You have not felt your baby move in over an hour.  You have severe headaches that do not go away when you take medicine.  You have vision changes. Summary  The second trimester is from week 14 through week 27 (months 4 through 6). It is also a time when the fetus is growing rapidly.  Your body goes through many changes during pregnancy. The changes vary from woman to woman.  Avoid all smoking, herbs, alcohol, and unprescribed drugs. These chemicals affect the formation and growth your baby.  Do not use any tobacco products, such as cigarettes, chewing tobacco, and e-cigarettes. If you need help quitting, ask your health care provider.  Contact your health care provider if you have any questions. Keep all prenatal visits as told by your health care provider. This is important. This information is not intended to replace advice given to you by your health care provider. Make sure you discuss any questions you have with your health care provider. Document Released: 05/11/2001 Document Revised: 10/23/2015 Document Reviewed: 07/18/2012 Elsevier Interactive Patient Education  2017 [employer].

## 2017-03-30 NOTE — Progress Notes (Signed)
NEW OB HISTORY AND PHYSICAL  SUBJECTIVE:       Cheryl Mills is a 21 y.o. G1P0 female, Patient's last menstrual period was 12/31/2016 (approximate)., Estimated Date of Delivery: 10/16/17, [id number], presents today for establishment of Prenatal Care. She has no unusual complaints.      Gynecologic History Patient's last menstrual period was 12/31/2016 (approximate). Normal Contraception: none Last Pap: ?Marland Kitchen. Results were: normal  Obstetric History OB History  Gravida Para Term Preterm AB Living  1            SAB TAB Ectopic Multiple Live Births               # Outcome Date GA Lbr Len/2nd Weight Sex Delivery Anes PTL Lv  1 Current               Past Medical History:  Diagnosis Date  . Herpes, genital    age [age]y first and only breakfast  . Missed menses   . PCOS (polycystic ovarian syndrome)     Past Surgical History:  Procedure Laterality Date  . TYMPANOSTOMY TUBE PLACEMENT      Current Outpatient Prescriptions on File Prior to Visit  Medication Sig Dispense Refill  . Prenatal Vit-Fe Fumarate-FA (MULTIVITAMIN-PRENATAL) 27-0.8 MG TABS tablet Take 1 tablet by mouth daily at 12 noon.    . fluconazole (DIFLUCAN) 100 MG tablet Take 1 tablet (100 mg total) by mouth daily. (Patient not taking: Reported on 03/30/2017) 5 tablet 0   No current facility-administered medications on file prior to visit.     No Known Allergies  Social History   Social History  . Marital status: Single    Spouse name: N/A  . Number of children: N/A  . Years of education: N/A   Occupational History  . Not on file.   Social History Main Topics  . Smoking status: Former Games developermoker  . Smokeless tobacco: Never Used  . Alcohol use No  . Drug use: No  . Sexual activity: Yes    Partners: Male    Birth control/ protection: None   Other Topics Concern  . Not on file   Social History Narrative  . No narrative on file    Family History  Problem Relation Age of Onset  . Migraines Mother   .  Hyperlipidemia Father   . Hypertension Father   . Asthma Sister   . Cancer Sister        cervical  . Migraines Sister   . Thyroid disease Sister   . Cancer Paternal Grandmother        breast, had mastectomy  . Diabetes Paternal Grandmother   . Hyperlipidemia Paternal Grandmother   . Hypertension Paternal Grandmother   . Thyroid disease Paternal Grandmother     The following portions of the patient's history were reviewed and updated as appropriate: allergies, current medications, past OB history, past medical history, past surgical history, past family history, past social history, and problem list.    OBJECTIVE: Initial Physical Exam (New OB)  GENERAL APPEARANCE: alert, well appearing, in no apparent distress, oriented to person, place and time, overweight HEAD: normocephalic, atraumatic MOUTH: mucous membranes moist, pharynx normal without lesions and dental hygiene good THYROID: no thyromegaly or masses present BREASTS: not examined LUNGS: clear to auscultation, no wheezes, rales or rhonchi, symmetric air entry HEART: regular rate and rhythm, no murmurs ABDOMEN: soft, nontender, nondistended, no abnormal masses, no epigastric pain, obese, fundus not palpable and FHT present EXTREMITIES: no redness or tenderness  in the calves or thighs SKIN: normal coloration and turgor, no rashes LYMPH NODES: no adenopathy palpable NEUROLOGIC: alert, oriented, normal speech, no focal findings or movement disorder noted  PELVIC EXAM Not done- will do pap next visit  ASSESSMENT: Normal pregnancy Obesity   PLAN: Early glucola Prenatal care See orders Declined genetic screening Will wait in [facility] as patient states had unprotected sex with same partner.

## 2017-03-30 NOTE — Progress Notes (Signed)
NOB physical- pt is doing well, having some fatigue

## 2017-04-27 ENCOUNTER — Ambulatory Visit (INDEPENDENT_AMBULATORY_CARE_PROVIDER_SITE_OTHER): Payer: Medicaid Other | Admitting: Certified Nurse Midwife

## 2017-04-27 ENCOUNTER — Encounter: Payer: Self-pay | Admitting: Certified Nurse Midwife

## 2017-04-27 ENCOUNTER — Other Ambulatory Visit: Payer: Self-pay

## 2017-04-27 ENCOUNTER — Other Ambulatory Visit: Payer: Medicaid Other

## 2017-04-27 VITALS — BP 112/70 | HR 82 | Wt 246.4 lb

## 2017-04-27 DIAGNOSIS — E669 Obesity, unspecified: Secondary | ICD-10-CM

## 2017-04-27 DIAGNOSIS — Z3492 Encounter for supervision of normal pregnancy, unspecified, second trimester: Secondary | ICD-10-CM

## 2017-04-27 LAB — POCT URINALYSIS DIPSTICK
BILIRUBIN UA: NEGATIVE
Blood, UA: NEGATIVE
GLUCOSE UA: NEGATIVE
Ketones, UA: NEGATIVE
LEUKOCYTES UA: NEGATIVE
NITRITE UA: NEGATIVE
PH UA: 6 (ref 5.0–8.0)
Protein, UA: NEGATIVE
Spec Grav, UA: 1.02 (ref 1.010–1.025)
Urobilinogen, UA: 0.2 E.U./dL

## 2017-04-27 NOTE — Patient Instructions (Signed)

## 2017-04-27 NOTE — Progress Notes (Signed)
ROB, doing well. Early Gtt today. She has not started to feel baby move yet. States that she occasionally feels some fluttering. Discussed GTT , will follow up with results. ROB in 4 wks for anatomy u/s and ROB .   Doreene BurkeAnnie Sharlyn Odonnel, [name]

## 2017-04-28 LAB — GLUCOSE, 1 HOUR GESTATIONAL: Gestational Diabetes Screen: 91 mg/dL (ref 65–139)

## 2017-04-29 ENCOUNTER — Encounter: Payer: Self-pay | Admitting: Certified Nurse Midwife

## 2017-05-10 ENCOUNTER — Other Ambulatory Visit: Payer: Self-pay | Admitting: Obstetrics and Gynecology

## 2017-05-10 DIAGNOSIS — Z369 Encounter for antenatal screening, unspecified: Secondary | ICD-10-CM

## 2017-05-12 ENCOUNTER — Encounter: Payer: Self-pay | Admitting: Obstetrics and Gynecology

## 2017-05-27 ENCOUNTER — Ambulatory Visit (INDEPENDENT_AMBULATORY_CARE_PROVIDER_SITE_OTHER): Payer: Medicaid Other | Admitting: Certified Nurse Midwife

## 2017-05-27 ENCOUNTER — Ambulatory Visit (INDEPENDENT_AMBULATORY_CARE_PROVIDER_SITE_OTHER): Payer: Medicaid Other

## 2017-05-27 VITALS — BP 132/88 | HR 94 | Wt 252.3 lb

## 2017-05-27 DIAGNOSIS — Z3492 Encounter for supervision of normal pregnancy, unspecified, second trimester: Secondary | ICD-10-CM

## 2017-05-27 DIAGNOSIS — Z369 Encounter for antenatal screening, unspecified: Secondary | ICD-10-CM

## 2017-05-27 DIAGNOSIS — Z3402 Encounter for supervision of normal first pregnancy, second trimester: Secondary | ICD-10-CM | POA: Diagnosis not present

## 2017-05-27 DIAGNOSIS — A749 Chlamydial infection, unspecified: Secondary | ICD-10-CM

## 2017-05-27 LAB — POCT URINALYSIS DIPSTICK
Bilirubin, UA: NEGATIVE
Blood, UA: NEGATIVE
GLUCOSE UA: NEGATIVE
KETONES UA: NEGATIVE
Leukocytes, UA: NEGATIVE
NITRITE UA: NEGATIVE
PROTEIN UA: NEGATIVE
SPEC GRAV UA: 1.02 (ref 1.010–1.025)
Urobilinogen, UA: 0.2 E.U./dL
pH, UA: 5 (ref 5.0–8.0)

## 2017-05-27 NOTE — Patient Instructions (Addendum)
WHAT OB PATIENTS CAN EXPECT   Confirmation of pregnancy and ultrasound ordered if medically indicated-[redacted] weeks gestation  New OB (NOB) intake with nurse and New OB (NOB) labs- [redacted] weeks gestation  New OB (NOB) physical examination with provider- 11/[redacted] weeks gestation  Flu vaccine-[redacted] weeks gestation  Anatomy scan-[redacted] weeks gestation  Glucose tolerance test, blood work to test for anemia, T-dap vaccine-[redacted] weeks gestation  Vaginal swabs/cultures-STD/Group B strep-[redacted] weeks gestation  Appointments every 4 weeks until 28 weeks  Every 2 weeks from 28 weeks until 36 weeks  Weekly visits from 36 weeks until delivery  Common Medications Safe in Pregnancy  Acne:      Constipation:  Benzoyl Peroxide     Colace  Clindamycin      Dulcolax Suppository  Topica Erythromycin     Fibercon  Salicylic Acid      Metamucil         Miralax AVOID:        Senakot   Accutane    Cough:  Retin-A       Cough Drops  Tetracycline      Phenergan w/ Codeine if Rx  Minocycline      Robitussin (Plain & DM)  Antibiotics:     Crabs/Lice:  Ceclor       RID  Cephalosporins    AVOID:  E-Mycins      Kwell  Keflex  Macrobid/Macrodantin   Diarrhea:  Penicillin      Kao-Pectate  Zithromax      Imodium AD         PUSH FLUIDS AVOID:       Cipro     Fever:  Tetracycline      Tylenol (Regular or Extra  Minocycline       Strength)  Levaquin      Extra Strength-Do not          Exceed 8 tabs/24 hrs Caffeine:        <253m/day (equiv. To 1 cup of coffee or  approx. 3 12 oz sodas)         Gas: Cold/Hayfever:       Gas-X  Benadryl      Mylicon  Claritin       Phazyme  **Claritin-D        Chlor-Trimeton    Headaches:  Dimetapp      ASA-Free Excedrin  Drixoral-Non-Drowsy     Cold Compress  Mucinex (Guaifenasin)     Tylenol (Regular or Extra  Sudafed/Sudafed-12 Hour     Strength)  **Sudafed PE Pseudoephedrine   Tylenol Cold & Sinus     Vicks Vapor Rub  Zyrtec  **AVOID if Problems With Blood  Pressure         Heartburn: Avoid lying down for at least 1 hour after meals  Aciphex      Maalox     Rash:  Milk of Magnesia     Benadryl    Mylanta       1% Hydrocortisone Cream  Pepcid  Pepcid Complete   Sleep Aids:  Prevacid      Ambien   Prilosec       Benadryl  Rolaids       Chamomile Tea  Tums (Limit 4/day)     Unisom  Zantac       Tylenol PM         Warm milk-add vanilla or  Hemorrhoids:       Sugar for taste  Anusol/Anusol H.C.  (RX: Analapram 2.5%)  Sugar Substitutes:  Hydrocortisone OTC     Ok in moderation  Preparation H      Tucks        Vaseline lotion applied to tissue with wiping    Herpes:     Throat:  Acyclovir      Oragel  Famvir  Valtrex     Vaccines:         Flu Shot Leg Cramps:       *Gardasil  Benadryl      Hepatitis A         Hepatitis B Nasal Spray:       Pneumovax  Saline Nasal Spray     Polio Booster         Tetanus Nausea:       Tuberculosis test or PPD  Vitamin B6 25 mg TID   AVOID:    Dramamine      *Gardasil  Emetrol       Live Poliovirus  Ginger Root 250 mg QID    MMR (measles, mumps &  High Complex Carbs @ Bedtime    rebella)  Sea Bands-Accupressure    Varicella (Chickenpox)  Unisom 1/2 tab TID     *No known complications           If received before Pain:         Known pregnancy;   Darvocet       Resume series after  Lortab        Delivery  Percocet    Yeast:   Tramadol      Femstat  Tylenol 3      Gyne-lotrimin  Ultram       Monistat  Vicodin           MISC:         All Sunscreens           Hair Coloring/highlights          Insect Repellant's          (Including DEET)         Mystic Tans Back Pain in Pregnancy Back pain during pregnancy is common. Back pain may be caused by several factors that are related to changes during your pregnancy. Follow these instructions at home: Managing pain, stiffness, and swelling  If directed, apply ice for sudden (acute) back pain. ? Put ice in a plastic bag. ? Place a towel  between your skin and the bag. ? Leave the ice on for 20 minutes, 2-3 times per day.  If directed, apply heat to the affected area before you exercise: ? Place a towel between your skin and the heat pack or heating pad. ? Leave the heat on for 20-30 minutes. ? Remove the heat if your skin turns bright red. This is especially important if you are unable to feel pain, heat, or cold. You may have a greater risk of getting burned. Activity  Exercise as told by your health care provider. Exercising is the best way to prevent or manage back pain.  Listen to your body when lifting. If lifting hurts, ask for help or bend your knees. This uses your leg muscles instead of your back muscles.  Squat down when picking up something from the floor. Do not bend over.  Only use bed rest as told by your health care provider. Bed rest should only be used for the most severe episodes of back pain. Standing, Sitting, and Lying Down  Do not stand in one place for   long periods of time.  Use good posture when sitting. Make sure your head rests over your shoulders and is not hanging forward. Use a pillow on your lower back if necessary.  Try sleeping on your side, preferably the left side, with a pillow or two between your legs. If you are sore after a night's rest, your bed may be too soft. A firm mattress may provide more support for your back during pregnancy. General instructions  Do not wear high heels.  Eat a healthy diet. Try to gain weight within your health care provider's recommendations.  Use a maternity girdle, elastic sling, or back brace as told by your health care provider.  Take over-the-counter and prescription medicines only as told by your health care provider.  Keep all follow-up visits as told by your health care provider. This is important. This includes any visits with any specialists, such as a physical therapist. Contact a health care provider if:  Your back pain interferes with  your daily activities.  You have increasing pain in other parts of your body. Get help right away if:  You develop numbness, tingling, weakness, or problems with the use of your arms or legs.  You develop severe back pain that is not controlled with medicine.  You have a sudden change in bowel or bladder control.  You develop shortness of breath, dizziness, or you faint.  You develop nausea, vomiting, or sweating.  You have back pain that is a rhythmic, cramping pain similar to labor pains. Labor pain is usually 1-2 minutes apart, lasts for about 1 minute, and involves a bearing down feeling or pressure in your pelvis.  You have back pain and your water breaks or you have vaginal bleeding.  You have back pain or numbness that travels down your leg.  Your back pain developed after you fell.  You develop pain on one side of your back.  You see blood in your urine.  You develop skin blisters in the area of your back pain. This information is not intended to replace advice given to you by your health care provider. Make sure you discuss any questions you have with your health care provider. Document Released: 08/25/2005 Document Revised: 10/23/2015 Document Reviewed: 01/29/2015 Elsevier Interactive Patient Education  2018 [employer] Round Ligament Pain The round ligament is a cord of muscle and tissue that helps to support the uterus. It can become a source of pain during pregnancy if it becomes stretched or twisted as the baby grows. The pain usually begins in the second trimester of pregnancy, and it can come and go until the baby is delivered. It is not a serious problem, and it does not cause harm to the baby. Round ligament pain is usually a short, sharp, and pinching pain, but it can also be a dull, lingering, and aching pain. The pain is felt in the lower side of the abdomen or in the groin. It usually starts deep in the groin and moves up to the outside of the hip area. Pain  can occur with:  A sudden change in position.  Rolling over in bed.  Coughing or sneezing.  Physical activity.  Follow these instructions at home: Watch your condition for any changes. Take these steps to help with your pain:  When the pain starts, relax. Then try: ? Sitting down. ? Flexing your knees up to your abdomen. ? Lying on your side with one pillow under your abdomen and another pillow between your legs. ?   Sitting in a warm bath for 15-20 minutes or until the pain goes away.  Take over-the-counter and prescription medicines only as told by your health care provider.  Move slowly when you sit and stand.  Avoid long walks if they cause pain.  Stop or lessen your physical activities if they cause pain.  Contact a health care provider if:  Your pain does not go away with treatment.  You feel pain in your back that you did not have before.  Your medicine is not helping. Get help right away if:  You develop a fever or chills.  You develop uterine contractions.  You develop vaginal bleeding.  You develop nausea or vomiting.  You develop diarrhea.  You have pain when you urinate. This information is not intended to replace advice given to you by your health care provider. Make sure you discuss any questions you have with your health care provider. Document Released: 02/24/2008 Document Revised: 10/23/2015 Document Reviewed: 07/24/2014 Elsevier Interactive Patient Education  2018 [place] for Pregnant Women While you are pregnant, your body will require additional nutrition to help support your growing baby. It is recommended that you consume:  150 additional calories each day during your first trimester.  300 additional calories each day during your second trimester.  300 additional calories each day during your third trimester.  Eating a healthy, well-balanced diet is very important for your health and for your baby's health. You also  have a higher need for some vitamins and minerals, such as folic acid, calcium, iron, and vitamin D. What do I need to know about eating during pregnancy?  Do not try to lose weight or go on a diet during pregnancy.  Choose healthy, nutritious foods. Choose  of a sandwich with a glass of milk instead of a candy bar or a high-calorie sugar-sweetened beverage.  Limit your overall intake of foods that have "empty calories." These are foods that have little nutritional value, such as sweets, desserts, candies, sugar-sweetened beverages, and fried foods.  Eat a variety of foods, especially fruits and vegetables.  Take a prenatal vitamin to help meet the additional needs during pregnancy, specifically for folic acid, iron, calcium, and vitamin D.  Remember to stay active. Ask your health care provider for exercise recommendations that are specific to you.  Practice good food safety and cleanliness, such as washing your hands before you eat and after you prepare raw meat. This helps to prevent foodborne illnesses, such as listeriosis, that can be very dangerous for your baby. Ask your health care provider for more information about listeriosis. What does 150 extra calories look like? Healthy options for an additional 150 calories each day could be any of the following:  Plain low-fat yogurt (6-8 oz) with  cup of berries.  1 apple with 2 teaspoons of peanut butter.  Cut-up vegetables with  cup of hummus.  Low-fat chocolate milk (8 oz or 1 cup).  1 string cheese with 1 medium orange.   of a peanut butter and jelly sandwich on whole-wheat bread (1 tsp of peanut butter).  For 300 calories, you could eat two of those healthy options each day. What is a healthy amount of weight to gain? The recommended amount of weight for you to gain is based on your pre-pregnancy BMI. If your pre-pregnancy BMI was:  Less than 18 (underweight), you should gain 28-40 lb.  18-24.9 (normal), you should gain  25-35 lb.  25-29.9 (overweight), you should gain 15-25  lb.  Greater than 30 (obese), you should gain 11-20 lb.  What if I am having twins or multiples? Generally, pregnant women who will be having twins or multiples may need to increase their daily calories by 300-600 calories each day. The recommended range for total weight gain is 25-54 lb, depending on your pre-pregnancy BMI. Talk with your health care provider for specific guidance about additional nutritional needs, weight gain, and exercise during your pregnancy. What foods can I eat? Grains Any grains. Try to choose whole grains, such as whole-wheat bread, oatmeal, or brown rice. Vegetables Any vegetables. Try to eat a variety of colors and types of vegetables to get a full range of vitamins and minerals. Remember to wash your vegetables well before eating. Fruits Any fruits. Try to eat a variety of colors and types of fruit to get a full range of vitamins and minerals. Remember to wash your fruits well before eating. Meats and Other Protein Sources Lean meats, including chicken, [place], fish, and lean cuts of beef, veal, or pork. Make sure that all meats are cooked to "well done." Tofu. Tempeh. Beans. Eggs. Peanut butter and other nut butters. Seafood, such as shrimp, crab, and lobster. If you choose fish, select types that are higher in omega-3 fatty acids, including salmon, herring, mussels, trout, sardines, and pollock. Make sure that all meats are cooked to food-safe temperatures. Dairy Pasteurized milk and milk alternatives. Pasteurized yogurt and pasteurized cheese. Cottage cheese. Sour cream. Beverages Water. Juices that contain 100% fruit juice or vegetable juice. Caffeine-free teas and decaffeinated coffee. Drinks that contain caffeine are okay to drink, but it is better to avoid caffeine. Keep your total caffeine intake to less than 200 mg each day (12 oz of coffee, tea, or soda) or as directed by your health care  provider. Condiments Any pasteurized condiments. Sweets and Desserts Any sweets and desserts. Fats and Oils Any fats and oils. The items listed above may not be a complete list of recommended foods or beverages. Contact your dietitian for more options. What foods are not recommended? Vegetables Unpasteurized (raw) vegetable juices. Fruits Unpasteurized (raw) fruit juices. Meats and Other Protein Sources Cured meats that have nitrates, such as bacon, salami, and hotdogs. Luncheon meats, bologna, or other deli meats (unless they are reheated until they are steaming hot). Refrigerated pate, meat spreads from a meat counter, smoked seafood that is found in the refrigerated section of a store. Raw fish, such as sushi or sashimi. High mercury content fish, such as tilefish, shark, swordfish, and king mackerel. Raw meats, such as tuna or beef tartare. Undercooked meats and poultry. Make sure that all meats are cooked to food-safe temperatures. Dairy Unpasteurized (raw) milk and any foods that have raw milk in them. Soft cheeses, such as feta, queso blanco, queso fresco, Brie, Camembert cheeses, blue-veined cheeses, and Panela cheese (unless it is made with pasteurized milk, which must be stated on the label). Beverages Alcohol. Sugar-sweetened beverages, such as sodas, teas, or energy drinks. Condiments Homemade fermented foods and drinks, such as pickles, sauerkraut, or kombucha drinks. (Store-bought pasteurized versions of these are okay.) Other Salads that are made in the store, such as ham salad, chicken salad, egg salad, tuna salad, and seafood salad. The items listed above may not be a complete list of foods and beverages to avoid. Contact your dietitian for more information. This information is not intended to replace advice given to you by your health care provider. Make sure you discuss any questions you have  with your health care provider. Document Released: 03/01/2014 Document Revised:  10/23/2015 Document Reviewed: 10/30/2013 Elsevier Interactive Patient Education  [employer].

## 2017-05-27 NOTE — Progress Notes (Signed)
Pt is here for an ROB visit. Pt states she had some spotting after intercourse.

## 2017-05-27 NOTE — Progress Notes (Signed)
ROB-Anatomy scan today, complete and normal. Findings reviewed with pt and sister, verbalized understanding. TOC for chlamydia today, pt concerned about spotting with sex. Anticipatory guidance regarding course of prenatal care. Reviewed red flag symptoms and when to call. RTC x 4 weeks for ROB with Pattricia BossAnnie or sooner if needed.  ULTRASOUND REPORT  Location: [facility] Date of Service:  05/27/17  Indications: Anatomy Findings:  Mason JimSingleton intrauterine pregnancy is visualized with FHR at 133 BPM. Biometrics give an (U/S) Gestational age of 23 2/7 weeks and an (U/S) EDD of 10/12/17; this correlates with the clinically established EDD of 10/16/17.  Fetal presentation is variable breech positions.  EFW: 350 grams (0lb 12oz). Placenta: Anterior and grade 1.  Placenta is 4.0 cm from cervical os. AFI: WNL subjectively.  Anatomic survey is complete and appears WNL - Limited due to maternal body habitus.; Gender - Surprise.   Neither ovary was visualized due to overlying bowel gas. Survey of the adnexa demonstrates no adnexal masses. There is no free peritoneal fluid in the cul de sac.  Impression: 1. 20 2/7 week Viable Singleton Intrauterine pregnancy by U/S. 2. (U/S) EDD is consistent with Clinically established (LMP) EDD of 10/16/17. 3. Normal Anatomy Scan 4. Limited due to maternal body habitus.  Recommendations: 1.Clinical correlation with the patient's History and Physical Exam.

## 2017-05-28 LAB — GC/CHLAMYDIA PROBE AMP
CHLAMYDIA, DNA PROBE: NEGATIVE
NEISSERIA GONORRHOEAE BY PCR: NEGATIVE

## 2017-05-31 NOTE — L&D Delivery Note (Addendum)
    Delivery Note   DEJAI SCHUBACH is a 22 y.o. G1P1001 at [redacted]w[redacted]d Estimated Date of Delivery: 10/16/17  PRE-OPERATIVE DIAGNOSIS:  1) [redacted]w[redacted]d pregnancy.   POST-OPERATIVE DIAGNOSIS:  1) [redacted]w[redacted]d pregnancy s/p Vaginal, Spontaneous   Delivery Type: Vaginal, Spontaneous    Delivery Anesthesia: Epidural   Labor Complications:   none    ESTIMATED BLOOD LOSS: 600 ml Postpartum hemorrhage.     FINDINGS:   1) female infant, Apgar scores of 8    at 1 minute and 9    at 5 minutes and a birthweight of 129.45  ounces.  (8lbs 1.4oz)  2) Nuchal cord: no  SPECIMENS:   PLACENTA:   Appearance: Intact , 3 vessel cord, cord blood sample collected   Removal: Spontaneous      Disposition:   held per protocol then discarded  DISPOSITION:  Infant to left in stable condition in the delivery room, with L&D personnel and mother,  NARRATIVE SUMMARY: Labor course:  Ms. LAURELLA TULL is a G1P1001 at [redacted]w[redacted]d who presented for induction of labor.  She progressed well in labor with cytotec and pitocin.  She received the appropriate epidural anesthesia and proceeded to complete dilation. She evidenced good maternal expulsive effort during the second stage. She went on to deliver a viable female infant "Marga Hoots". The placenta delivered without problems and was noted to be complete. A perineal and vaginal examination was performed. Lacerations:    Bilateral vaginal side wall. Right side hemostatic not in need of repair. Left side repaired with 3-0 vicryl Rapide suture.  The patient tolerated this well.  Doreene Burke, [name]  10/11/2017 7:03 PM

## 2017-06-24 ENCOUNTER — Encounter: Payer: Self-pay | Admitting: Certified Nurse Midwife

## 2017-06-24 ENCOUNTER — Ambulatory Visit (INDEPENDENT_AMBULATORY_CARE_PROVIDER_SITE_OTHER): Payer: Medicaid Other | Admitting: Certified Nurse Midwife

## 2017-06-24 VITALS — BP 99/72 | HR 82 | Wt 262.1 lb

## 2017-06-24 DIAGNOSIS — Z3492 Encounter for supervision of normal pregnancy, unspecified, second trimester: Secondary | ICD-10-CM

## 2017-06-24 LAB — POCT URINALYSIS DIPSTICK
Bilirubin, UA: NEGATIVE
Glucose, UA: NEGATIVE
Ketones, UA: NEGATIVE
LEUKOCYTES UA: NEGATIVE
NITRITE UA: NEGATIVE
PROTEIN UA: NEGATIVE
RBC UA: NEGATIVE
SPEC GRAV UA: 1.015 (ref 1.010–1.025)
Urobilinogen, UA: 0.2 E.U./dL
pH, UA: 5 (ref 5.0–8.0)

## 2017-06-24 NOTE — Patient Instructions (Signed)

## 2017-06-24 NOTE — Progress Notes (Signed)
ROB doing well. Feels fetal movement. No complaints. Anticipatory guidance for 28 wk visit. Encouraged birth classes , scheduled given to pt. Follow up 4 wks.   Doreene BurkeAnnie Jovahn Breit, [name]

## 2017-06-24 NOTE — Progress Notes (Signed)
Pt is here for an ROB visit. No c/o. 

## 2017-07-25 ENCOUNTER — Other Ambulatory Visit: Payer: Self-pay | Admitting: Certified Nurse Midwife

## 2017-07-25 ENCOUNTER — Encounter: Payer: Self-pay | Admitting: Certified Nurse Midwife

## 2017-07-25 ENCOUNTER — Ambulatory Visit (INDEPENDENT_AMBULATORY_CARE_PROVIDER_SITE_OTHER): Payer: Medicaid Other | Admitting: Certified Nurse Midwife

## 2017-07-25 ENCOUNTER — Other Ambulatory Visit (INDEPENDENT_AMBULATORY_CARE_PROVIDER_SITE_OTHER): Payer: Medicaid Other

## 2017-07-25 ENCOUNTER — Other Ambulatory Visit: Payer: Medicaid Other

## 2017-07-25 VITALS — BP 108/68 | HR 88 | Wt 274.1 lb

## 2017-07-25 DIAGNOSIS — O99210 Obesity complicating pregnancy, unspecified trimester: Secondary | ICD-10-CM

## 2017-07-25 DIAGNOSIS — O2603 Excessive weight gain in pregnancy, third trimester: Secondary | ICD-10-CM

## 2017-07-25 DIAGNOSIS — Z6711 Type A blood, Rh negative: Secondary | ICD-10-CM

## 2017-07-25 DIAGNOSIS — Z3403 Encounter for supervision of normal first pregnancy, third trimester: Secondary | ICD-10-CM

## 2017-07-25 DIAGNOSIS — Z23 Encounter for immunization: Secondary | ICD-10-CM

## 2017-07-25 DIAGNOSIS — Z6841 Body Mass Index (BMI) 40.0 and over, adult: Secondary | ICD-10-CM | POA: Diagnosis not present

## 2017-07-25 LAB — POCT URINALYSIS DIPSTICK
BILIRUBIN UA: NEGATIVE
Blood, UA: NEGATIVE
GLUCOSE UA: NEGATIVE
Ketones, UA: NEGATIVE
Leukocytes, UA: NEGATIVE
Nitrite, UA: NEGATIVE
PH UA: 6 (ref 5.0–8.0)
Protein, UA: NEGATIVE
Spec Grav, UA: 1.015 (ref 1.010–1.025)
UROBILINOGEN UA: 0.2 U/dL

## 2017-07-25 MED ORDER — RHO D IMMUNE GLOBULIN 1500 UNITS IM SOSY
1500.0000 [IU] | PREFILLED_SYRINGE | Freq: Once | INTRAMUSCULAR | Status: AC
  Administered 2017-07-25: 1500 [IU] via INTRAMUSCULAR

## 2017-07-25 NOTE — Progress Notes (Addendum)
ROB-Doing well, 28 week labs and growth US today. US wnl, findings reviewed with patient.  Discussed weight gain in pregnancy; will refer to MNT, see orders. 28 week labs and PIH baseline labs due to obesity collected today. TDaP/Rhogam given. Blood transfusion consent signed. Education regarding childbirth classes, intrapartum pain management options, contraception, and pediatrician selection; correspond handouts given. Plans breastfeeding and desires epidural.   ULTRASOUND REPORT  Location: [facility] Date of Service:  07/25/2017  Indications: Growth Findings:  Singleton intrauterine pregnancy is visualized with FHR at 144 BPM. Biometrics give an (U/S) Gestational age of 24 5/7 weeks and an (U/S) EDD of 10/05/17; this correlates with the clinically established EDD of 10/16/17.  Fetal presentation is vertex.  EFW: 1531 grams (3lb 6oz).  76th percentile.  AC measures 97th percentile. Placenta: Anterior and grade 1. AFI: 10.5 cm.  Fetal stomach, kidneys, and bladder appear WNL.  Impression: 1. 29 5/7 week Viable Singleton Intrauterine pregnancy by U/S. 2. (U/S) EDD is consistent with Clinically established (LMP) EDD of 10/16/17. 3. EFW: 1531 grams (3lb 6oz).  76th percentile.  AC measures 97th percentile.  Recommendations: 1.Clinical correlation with the patient's History and Physical Exam.

## 2017-07-25 NOTE — Progress Notes (Signed)
Pt is here for an ROB visit. Is due for a Tdap and Rhogam.

## 2017-07-25 NOTE — Patient Instructions (Addendum)
Common Medications Safe in Pregnancy  Acne:      Constipation:  Benzoyl Peroxide     Colace  Clindamycin      Dulcolax Suppository  Topica Erythromycin     Fibercon  Salicylic Acid      Metamucil         Miralax AVOID:        Senakot   Accutane    Cough:  Retin-A       Cough Drops  Tetracycline      Phenergan w/ Codeine if Rx  Minocycline      Robitussin (Plain & DM)  Antibiotics:     Crabs/Lice:  Ceclor       RID  Cephalosporins    AVOID:  E-Mycins      Kwell  Keflex  Macrobid/Macrodantin   Diarrhea:  Penicillin      Kao-Pectate  Zithromax      Imodium AD         PUSH FLUIDS AVOID:       Cipro     Fever:  Tetracycline      Tylenol (Regular or Extra  Minocycline       Strength)  Levaquin      Extra Strength-Do not          Exceed 8 tabs/24 hrs Caffeine:        <200mg/day (equiv. To 1 cup of coffee or  approx. 3 12 oz sodas)         Gas: Cold/Hayfever:       Gas-X  Benadryl      Mylicon  Claritin       Phazyme  **Claritin-D        Chlor-Trimeton    Headaches:  Dimetapp      ASA-Free Excedrin  Drixoral-Non-Drowsy     Cold Compress  Mucinex (Guaifenasin)     Tylenol (Regular or Extra  Sudafed/Sudafed-12 Hour     Strength)  **Sudafed PE Pseudoephedrine   Tylenol Cold & Sinus     Vicks Vapor Rub  Zyrtec  **AVOID if Problems With Blood Pressure         Heartburn: Avoid lying down for at least 1 hour after meals  Aciphex      Maalox     Rash:  Milk of Magnesia     Benadryl    Mylanta       1% Hydrocortisone Cream  Pepcid  Pepcid Complete   Sleep Aids:  Prevacid      Ambien   Prilosec       Benadryl  Rolaids       Chamomile Tea  Tums (Limit 4/day)     Unisom  Zantac       Tylenol PM         Warm milk-add vanilla or  Hemorrhoids:       Sugar for taste  Anusol/Anusol H.C.  (RX: Analapram 2.5%)  Sugar Substitutes:  Hydrocortisone OTC     Ok in moderation  Preparation H      Tucks        Vaseline lotion applied to tissue with  wiping    Herpes:     Throat:  Acyclovir      Oragel  Famvir  Valtrex     Vaccines:         Flu Shot Leg Cramps:       *Gardasil  Benadryl      Hepatitis A         Hepatitis B Nasal Spray:         Pneumovax  Saline Nasal Spray     Polio Booster         Tetanus Nausea:       Tuberculosis test or PPD  Vitamin B6 25 mg TID   AVOID:    Dramamine      *Gardasil  Emetrol       Live Poliovirus  Ginger Root 250 mg QID    MMR (measles, mumps &  High Complex Carbs @ Bedtime    rebella)  Sea Bands-Accupressure    Varicella (Chickenpox)  Unisom 1/2 tab TID     *No known complications           If received before Pain:         Known pregnancy;   Darvocet       Resume series after  Lortab        Delivery  Percocet    Yeast:   Tramadol      Femstat  Tylenol 3      Gyne-lotrimin  Ultram       Monistat  Vicodin           MISC:         All Sunscreens           Hair Coloring/highlights          Insect Repellant's          (Including DEET)         Mystic Tans Pain Relief During Labor and Delivery Many things can cause pain during labor and delivery, including:  Pressure on bones and ligaments due to the baby moving through the pelvis.  Stretching of tissues due to the baby moving through the birth canal.  Muscle tension due to anxiety or nervousness.  The uterus tightening (contracting) and relaxing to help move the baby.  There are many ways to deal with the pain of labor and delivery. They include:  Taking prenatal classes. Taking these classes helps you know what to expect during your baby's birth. What you learn will increase your confidence and decrease your anxiety.  Practicing relaxation techniques or doing relaxing activities, such as: ? Focused breathing. ? Meditation. ? Visualization. ? Aroma therapy. ? Listening to your favorite music. ? Hypnosis.  Taking a warm shower or bath (hydrotherapy). This may: ? Provide comfort and relaxation. ? Lessen your perception of  pain. ? Decrease the amount of pain medicine needed. ? Decrease the length of labor.  Getting a massage or counterpressure on your back.  Applying warm packs or ice packs.  Changing positions often, moving around, or using a birthing ball.  Getting: ? Pain medicine through an IV or injection into a muscle. ? Pain medicine inserted into your spinal column. ? Injections of sterile water just under the skin on your lower back (intradermal injections). ? Laughing gas (nitrous oxide).  Discuss your pain control options with your health care provider during your prenatal visits. Explore the options offered by your hospital or birth center. What kinds of medicine are available? There are two kinds of medicines that can be used to relieve pain during labor and delivery:  Analgesics. These medicines decrease pain without causing you to lose feeling or the ability to move your muscles.  Anesthetics. These medicines block feeling in the body and can decrease your ability to move freely.  Both of these kinds of medicine can cause minor side effects, such as nausea, trouble concentrating, and sleepiness. They can also decrease the baby's heart rate before  birth and affect the baby's breathing rate after birth. For this reason, health care providers are careful about when and how much medicine is given. What are specific medicines and procedures that provide pain relief? Local Anesthetics Local anesthetics are used to numb a small area of the body. They may be used along with another kind of anesthetic or used to numb the nerves of the vagina, cervix, and perineum during the second stage of labor. General Anesthetics General anesthetics cause you to lose consciousness so you do not feel pain. They are usually only used for an emergency cesarean delivery. General anesthetics are given through an IV tube and a mask. Pudendal Block A pudendal block is a form of local anesthetic. It may be used to  relieve the pain associated with pushing or stretching of the perineum at the time of delivery or to further numb the perineum. A pudendal block is done by injecting numbing medicine through the vaginal wall into a nerve in the pelvis. Epidural Analgesia Epidural analgesia is given through a flexible IV catheter that is inserted into the lower back. Numbing medicine is delivered continuously to the area near your spinal column nerves (epidural space). After having this type of analgesia, you may be able to move your legs but you most likely will not be able to walk. Depending on the amount of medicine given, you may lose all feeling in the lower half of your body, or you may retain some level of sensation, including the urge to push. Epidural analgesia can be used to provide pain relief for a vaginal birth. Spinal Block A spinal block is similar to epidural analgesia, but the medicine is injected into the spinal fluid instead of the epidural space. A spinal block is only given once. It starts to relieve pain quickly, but the pain relief lasts only 1-6 hours. Spinal blocks can be used for cesarean deliveries. Combined Spinal-Epidural (CSE) Block A CSE block combines the effects of a spinal block and epidural analgesia. The spinal block works quickly to block all pain. The epidural analgesia provides continuous pain relief, even after the effects of the spinal block have worn off. This information is not intended to replace advice given to you by your health care provider. Make sure you discuss any questions you have with your health care provider. Document Released: 09/02/2008 Document Revised: 10/24/2015 Document Reviewed: 10/08/2015 Elsevier Interactive Patient Education  2018 [employer].   Breastfeeding Choosing to breastfeed is one of the best decisions you can make for yourself and your baby. A change in hormones during pregnancy causes your breasts to make breast milk in your milk-producing  glands. Hormones prevent breast milk from being released before your baby is born. They also prompt milk flow after birth. Once breastfeeding has begun, thoughts of your baby, as well as his or her sucking or crying, can stimulate the release of milk from your milk-producing glands. Benefits of breastfeeding Research shows that breastfeeding offers many health benefits for infants and mothers. It also offers a cost-free and convenient way to feed your baby. For your baby  Your first milk (colostrum) helps your baby's digestive system to function better.  Special cells in your milk (antibodies) help your baby to fight off infections.  Breastfed babies are less likely to develop asthma, allergies, obesity, or type 2 diabetes. They are also at lower risk for sudden infant death syndrome (SIDS).  Nutrients in breast milk are better able to meet your baby's needs compared to infant  formula.  Breast milk improves your baby's brain development. For you  Breastfeeding helps to create a very special bond between you and your baby.  Breastfeeding is convenient. Breast milk costs nothing and is always available at the correct temperature.  Breastfeeding helps to burn calories. It helps you to lose the weight that you gained during pregnancy.  Breastfeeding makes your uterus return faster to its size before pregnancy. It also slows bleeding (lochia) after you give birth.  Breastfeeding helps to lower your risk of developing type 2 diabetes, osteoporosis, rheumatoid arthritis, cardiovascular disease, and breast, ovarian, uterine, and endometrial cancer later in life. Breastfeeding basics Starting breastfeeding  Find a comfortable place to sit or lie down, with your neck and back well-supported.  Place a pillow or a rolled-up blanket under your baby to bring him or her to the level of your breast (if you are seated). Nursing pillows are specially designed to help support your arms and your baby while  you breastfeed.  Make sure that your baby's tummy (abdomen) is facing your abdomen.  Gently massage your breast. With your fingertips, massage from the outer edges of your breast inward toward the nipple. This encourages milk flow. If your milk flows slowly, you may need to continue this action during the feeding.  Support your breast with 4 fingers underneath and your thumb above your nipple (make the letter "C" with your hand). Make sure your fingers are well away from your nipple and your baby's mouth.  Stroke your baby's lips gently with your finger or nipple.  When your baby's mouth is open wide enough, quickly bring your baby to your breast, placing your entire nipple and as much of the areola as possible into your baby's mouth. The areola is the colored area around your nipple. ? More areola should be visible above your baby's upper lip than below the lower lip. ? Your baby's lips should be opened and extended outward (flanged) to ensure an adequate, comfortable latch. ? Your baby's tongue should be between his or her lower gum and your breast.  Make sure that your baby's mouth is correctly positioned around your nipple (latched). Your baby's lips should create a seal on your breast and be turned out (everted).  It is common for your baby to suck about 2-3 minutes in order to start the flow of breast milk. Latching Teaching your baby how to latch onto your breast properly is very important. An improper latch can cause nipple pain, decreased milk supply, and poor weight gain in your baby. Also, if your baby is not latched onto your nipple properly, he or she may swallow some air during feeding. This can make your baby fussy. Burping your baby when you switch breasts during the feeding can help to get rid of the air. However, teaching your baby to latch on properly is still the best way to prevent fussiness from swallowing air while breastfeeding. Signs that your baby has successfully  latched onto your nipple  Silent tugging or silent sucking, without causing you pain. Infant's lips should be extended outward (flanged).  Swallowing heard between every 3-4 sucks once your milk has started to flow (after your let-down milk reflex occurs).  Muscle movement above and in front of his or her ears while sucking.  Signs that your baby has not successfully latched onto your nipple  Sucking sounds or smacking sounds from your baby while breastfeeding.  Nipple pain.  If you think your baby has not latched on  correctly, slip your finger into the corner of your baby's mouth to break the suction and place it between your baby's gums. Attempt to start breastfeeding again. Signs of successful breastfeeding Signs from your baby  Your baby will gradually decrease the number of sucks or will completely stop sucking.  Your baby will fall asleep.  Your baby's body will relax.  Your baby will retain a small amount of milk in his or her mouth.  Your baby will let go of your breast by himself or herself.  Signs from you  Breasts that have increased in firmness, weight, and size 1-3 hours after feeding.  Breasts that are softer immediately after breastfeeding.  Increased milk volume, as well as a change in milk consistency and color by the fifth day of breastfeeding.  Nipples that are not sore, cracked, or bleeding.  Signs that your baby is getting enough milk  Wetting at least 1-2 diapers during the first 24 hours after birth.  Wetting at least 5-6 diapers every 24 hours for the first week after birth. The urine should be clear or pale yellow by the age of 5 days.  Wetting 6-8 diapers every 24 hours as your baby continues to grow and develop.  At least 3 stools in a 24-hour period by the age of 5 days. The stool should be soft and yellow.  At least 3 stools in a 24-hour period by the age of 7 days. The stool should be seedy and yellow.  No loss of weight greater than 10%  of birth weight during the first 3 days of life.  Average weight gain of 4-7 oz (113-198 g) per week after the age of 4 days.  Consistent daily weight gain by the age of 5 days, without weight loss after the age of 2 weeks. After a feeding, your baby may spit up a small amount of milk. This is normal. Breastfeeding frequency and duration Frequent feeding will help you make more milk and can prevent sore nipples and extremely full breasts (breast engorgement). Breastfeed when you feel the need to reduce the fullness of your breasts or when your baby shows signs of hunger. This is called "breastfeeding on demand." Signs that your baby is hungry include:  Increased alertness, activity, or restlessness.  Movement of the head from side to side.  Opening of the mouth when the corner of the mouth or cheek is stroked (rooting).  Increased sucking sounds, smacking lips, cooing, sighing, or squeaking.  Hand-to-mouth movements and sucking on fingers or hands.  Fussing or crying.  Avoid introducing a pacifier to your baby in the first 4-6 weeks after your baby is born. After this time, you may choose to use a pacifier. Research has shown that pacifier use during the first year of a baby's life decreases the risk of sudden infant death syndrome (SIDS). Allow your baby to feed on each breast as long as he or she wants. When your baby unlatches or falls asleep while feeding from the first breast, offer the second breast. Because newborns are often sleepy in the first few weeks of life, you may need to awaken your baby to get him or her to feed. Breastfeeding times will vary from baby to baby. However, the following rules can serve as a guide to help you make sure that your baby is properly fed:  Newborns (babies 75 weeks of age or younger) may breastfeed every 1-3 hours.  Newborns should not go without breastfeeding for longer than 3  hours during the day or 5 hours during the night.  You should  breastfeed your baby a minimum of 8 times in a 24-hour period.  Breast milk pumping Pumping and storing breast milk allows you to make sure that your baby is exclusively fed your breast milk, even at times when you are unable to breastfeed. This is especially important if you go back to work while you are still breastfeeding, or if you are not able to be present during feedings. Your lactation consultant can help you find a method of pumping that works best for you and give you guidelines about how long it is safe to store breast milk. Caring for your breasts while you breastfeed Nipples can become dry, cracked, and sore while breastfeeding. The following recommendations can help keep your breasts moisturized and healthy:  Avoid using soap on your nipples.  Wear a supportive bra designed especially for nursing. Avoid wearing underwire-style bras or extremely tight bras (sports bras).  Air-dry your nipples for 3-4 minutes after each feeding.  Use only cotton bra pads to absorb leaked breast milk. Leaking of breast milk between feedings is normal.  Use lanolin on your nipples after breastfeeding. Lanolin helps to maintain your skin's normal moisture barrier. Pure lanolin is not harmful (not toxic) to your baby. You may also hand express a few drops of breast milk and gently massage that milk into your nipples and allow the milk to air-dry.  In the first few weeks after giving birth, some women experience breast engorgement. Engorgement can make your breasts feel heavy, warm, and tender to the touch. Engorgement peaks within 3-5 days after you give birth. The following recommendations can help to ease engorgement:  Completely empty your breasts while breastfeeding or pumping. You may want to start by applying warm, moist heat (in the shower or with warm, water-soaked hand towels) just before feeding or pumping. This increases circulation and helps the milk flow. If your baby does not completely empty  your breasts while breastfeeding, pump any extra milk after he or she is finished.  Apply ice packs to your breasts immediately after breastfeeding or pumping, unless this is too uncomfortable for you. To do this: ? Put ice in a plastic bag. ? Place a towel between your skin and the bag. ? Leave the ice on for 20 minutes, 2-3 times a day.  Make sure that your baby is latched on and positioned properly while breastfeeding.  If engorgement persists after 48 hours of following these recommendations, contact your health care provider or a Science writer. Overall health care recommendations while breastfeeding  Eat 3 healthy meals and 3 snacks every day. Well-nourished mothers who are breastfeeding need an additional 450-500 calories a day. You can meet this requirement by increasing the amount of a balanced diet that you eat.  Drink enough water to keep your urine pale yellow or clear.  Rest often, relax, and continue to take your prenatal vitamins to prevent fatigue, stress, and low vitamin and mineral levels in your body (nutrient deficiencies).  Do not use any products that contain nicotine or tobacco, such as cigarettes and e-cigarettes. Your baby may be harmed by chemicals from cigarettes that pass into breast milk and exposure to secondhand smoke. If you need help quitting, ask your health care provider.  Avoid alcohol.  Do not use illegal drugs or marijuana.  Talk with your health care provider before taking any medicines. These include over-the-counter and prescription medicines as well as vitamins and  herbal supplements. Some medicines that may be harmful to your baby can pass through breast milk.  It is possible to become pregnant while breastfeeding. If birth control is desired, ask your health care provider about options that will be safe while breastfeeding your baby. Where to find more information: [employer]: www.llli.org Contact a health care provider  if:  You feel like you want to stop breastfeeding or have become frustrated with breastfeeding.  Your nipples are cracked or bleeding.  Your breasts are red, tender, or warm.  You have: ? Painful breasts or nipples. ? A swollen area on either breast. ? A fever or chills. ? Nausea or vomiting. ? Drainage other than breast milk from your nipples.  Your breasts do not become full before feedings by the fifth day after you give birth.  You feel sad and depressed.  Your baby is: ? Too sleepy to eat well. ? Having trouble sleeping. ? More than 3 week old and wetting fewer than 6 diapers in a 24-hour period. ? Not gaining weight by 63 days of age.  Your baby has fewer than 3 stools in a 24-hour period.  Your baby's skin or the white parts of his or her eyes become yellow. Get help right away if:  Your baby is overly tired (lethargic) and does not want to wake up and feed.  Your baby develops an unexplained fever. Summary  Breastfeeding offers many health benefits for infant and mothers.  Try to breastfeed your infant when he or she shows early signs of hunger.  Gently tickle or stroke your baby's lips with your finger or nipple to allow the baby to open his or her mouth. Bring the baby to your breast. Make sure that much of the areola is in your baby's mouth. Offer one side and burp the baby before you offer the other side.  Talk with your health care provider or lactation consultant if you have questions or you face problems as you breastfeed. This information is not intended to replace advice given to you by your health care provider. Make sure you discuss any questions you have with your health care provider. Document Released: 05/17/2005 Document Revised: 06/18/2016 Document Reviewed: 06/18/2016 Elsevier Interactive Patient Education  2018 [employer]. Cord Blood Banking Information Cord blood banking is the process of collecting and storing the blood that is in the  umbilical cord and placenta at the time of delivery. This blood contains stem cells, which can be used to treat many blood diseases, immune system disorders, and childhood cancers. Stem cells can also be used to research certain diseases and treatments. Many people who choose cord blood banking donate the blood. Donated blood can be used in lifesaving treatments or for research. Other people choose to store the blood privately. Blood that is stored privately can only be used with the person's permission. This option is often chosen if:  A family member needs a stem cell transplant.  The child is part of an ethnic minority.  The child was conceived through in vitro fertilization.  What should I look for in a blood bank? A blood bank is the organization that coordinates cord blood banking. Make sure the cord blood bank that you use:  Is accredited.  Is financially stable.  Handles a large volume of cord blood samples.  Has a procedure in place for transport and storage.  Allows you the option of transferring your cord blood sample.  Has a procedure in place if  the bank goes out of business.  Clearly states all costs and limits to future costs.  People who choose to donate cord blood should not need to pay for blood banking. People who keep the blood for private use will need to pay for the first (initial) storage and pay a fee each year (annual fee). Other fees may also apply. What are the risks of cord blood banking? There are no health risks associated with cord blood banking. It is considered safe. How should I prepare? You must schedule this process at least 4-6 weeks before you will be giving birth. How is the blood collected? The blood is collected as soon as the baby has been delivered. Within 15 minutes of delivery, a health care provider will take these actions to collect the blood:  Clamp the umbilical cord at the top and bottom. This traps the blood in the umbilical  cord.  Use a syringe or bag to collect the blood.  Insert needles into the placenta to collect (draw out) more blood.  What happens after the blood is collected? After the blood has been collected:  The blood will be sent to a blood bank.  The blood will be tested for genetic problems and infectious diseases. If the blood tests positive for a genetic problem or a disease, someone will contact you and let you know.  The blood will be frozen.  If your child develops a genetic condition, immune system disorder, or cancer, you will be responsible for contacting the blood bank and letting them know. This information is not intended to replace advice given to you by your health care provider. Make sure you discuss any questions you have with your health care provider. Document Released: 11/04/2009 Document Revised: 10/23/2015 Document Reviewed: 11/04/2014 Elsevier Interactive Patient Education  2018 [place] of Pregnancy The third trimester is from week 29 through week 42, months 7 through 9. This trimester is when your unborn baby (fetus) is growing very fast. At the end of the ninth month, the unborn baby is about 20 inches in length. It weighs about 6-10 pounds. Follow these instructions at home:  Avoid all smoking, herbs, and alcohol. Avoid drugs not approved by your doctor.  Do not use any tobacco products, including cigarettes, chewing tobacco, and electronic cigarettes. If you need help quitting, ask your doctor. You may get counseling or other support to help you quit.  Only take medicine as told by your doctor. Some medicines are safe and some are not during pregnancy.  Exercise only as told by your doctor. Stop exercising if you start having cramps.  Eat regular, healthy meals.  Wear a good support bra if your breasts are tender.  Do not use hot tubs, steam rooms, or saunas.  Wear your seat belt when driving.  Avoid raw meat, uncooked cheese, and liter  boxes and soil used by cats.  Take your prenatal vitamins.  Take 1500-2000 milligrams of calcium daily starting at the 20th week of pregnancy until you deliver your baby.  Try taking medicine that helps you poop (stool softener) as needed, and if your doctor approves. Eat more fiber by eating fresh fruit, vegetables, and whole grains. Drink enough fluids to keep your pee (urine) clear or pale yellow.  Take warm water baths (sitz baths) to soothe pain or discomfort caused by hemorrhoids. Use hemorrhoid cream if your doctor approves.  If you have puffy, bulging veins (varicose veins), wear support hose. Raise (elevate) your feet for  15 minutes, 3-4 times a day. Limit salt in your diet.  Avoid heavy lifting, wear low heels, and sit up straight.  Rest with your legs raised if you have leg cramps or low back pain.  Visit your dentist if you have not gone during your pregnancy. Use a soft toothbrush to brush your teeth. Be gentle when you floss.  You can have sex (intercourse) unless your doctor tells you not to.  Do not travel far distances unless you must. Only do so with your doctor's approval.  Take prenatal classes.  Practice driving to the hospital.  Pack your hospital bag.  Prepare the baby's room.  Go to your doctor visits. Get help if:  You are not sure if you are in labor or if your water has broken.  You are dizzy.  You have mild cramps or pressure in your lower belly (abdominal).  You have a nagging pain in your belly area.  You continue to feel sick to your stomach (nauseous), throw up (vomit), or have watery poop (diarrhea).  You have bad smelling fluid coming from your vagina.  You have pain with peeing (urination). Get help right away if:  You have a fever.  You are leaking fluid from your vagina.  You are spotting or bleeding from your vagina.  You have severe belly cramping or pain.  You lose or gain weight rapidly.  You have trouble catching your  breath and have chest pain.  You notice sudden or extreme puffiness (swelling) of your face, hands, ankles, feet, or legs.  You have not felt the baby move in over an hour.  You have severe headaches that do not go away with medicine.  You have vision changes. This information is not intended to replace advice given to you by your health care provider. Make sure you discuss any questions you have with your health care provider. Document Released: 08/11/2009 Document Revised: 10/23/2015 Document Reviewed: 07/18/2012 Elsevier Interactive Patient Education  2017 [place] for Pregnant Women While you are pregnant, your body will require additional nutrition to help support your growing baby. It is recommended that you consume:  150 additional calories each day during your first trimester.  300 additional calories each day during your second trimester.  300 additional calories each day during your third trimester.  Eating a healthy, well-balanced diet is very important for your health and for your baby's health. You also have a higher need for some vitamins and minerals, such as folic acid, calcium, iron, and vitamin D. What do I need to know about eating during pregnancy?  Do not try to lose weight or go on a diet during pregnancy.  Choose healthy, nutritious foods. Choose  of a sandwich with a glass of milk instead of a candy bar or a high-calorie sugar-sweetened beverage.  Limit your overall intake of foods that have "empty calories." These are foods that have little nutritional value, such as sweets, desserts, candies, sugar-sweetened beverages, and fried foods.  Eat a variety of foods, especially fruits and vegetables.  Take a prenatal vitamin to help meet the additional needs during pregnancy, specifically for folic acid, iron, calcium, and vitamin D.  Remember to stay active. Ask your health care provider for exercise recommendations that are specific to  you.  Practice good food safety and cleanliness, such as washing your hands before you eat and after you prepare raw meat. This helps to prevent foodborne illnesses, such as listeriosis, that can be very dangerous  for your baby. Ask your health care provider for more information about listeriosis. What does 150 extra calories look like? Healthy options for an additional 150 calories each day could be any of the following:  Plain low-fat yogurt (6-8 oz) with  cup of berries.  1 apple with 2 teaspoons of peanut butter.  Cut-up vegetables with  cup of hummus.  Low-fat chocolate milk (8 oz or 1 cup).  1 string cheese with 1 medium orange.   of a peanut butter and jelly sandwich on whole-wheat bread (1 tsp of peanut butter).  For 300 calories, you could eat two of those healthy options each day. What is a healthy amount of weight to gain? The recommended amount of weight for you to gain is based on your pre-pregnancy BMI. If your pre-pregnancy BMI was:  Less than 18 (underweight), you should gain 28-40 lb.  18-24.9 (normal), you should gain 25-35 lb.  25-29.9 (overweight), you should gain 15-25 lb.  Greater than 30 (obese), you should gain 11-20 lb.  What if I am having twins or multiples? Generally, pregnant women who will be having twins or multiples may need to increase their daily calories by 300-600 calories each day. The recommended range for total weight gain is 25-54 lb, depending on your pre-pregnancy BMI. Talk with your health care provider for specific guidance about additional nutritional needs, weight gain, and exercise during your pregnancy. What foods can I eat? Grains Any grains. Try to choose whole grains, such as whole-wheat bread, oatmeal, or brown rice. Vegetables Any vegetables. Try to eat a variety of colors and types of vegetables to get a full range of vitamins and minerals. Remember to wash your vegetables well before eating. Fruits Any fruits. Try to eat  a variety of colors and types of fruit to get a full range of vitamins and minerals. Remember to wash your fruits well before eating. Meats and Other Protein Sources Lean meats, including chicken, [place], fish, and lean cuts of beef, veal, or pork. Make sure that all meats are cooked to "well done." Tofu. Tempeh. Beans. Eggs. Peanut butter and other nut butters. Seafood, such as shrimp, crab, and lobster. If you choose fish, select types that are higher in omega-3 fatty acids, including salmon, herring, mussels, trout, sardines, and pollock. Make sure that all meats are cooked to food-safe temperatures. Dairy Pasteurized milk and milk alternatives. Pasteurized yogurt and pasteurized cheese. Cottage cheese. Sour cream. Beverages Water. Juices that contain 100% fruit juice or vegetable juice. Caffeine-free teas and decaffeinated coffee. Drinks that contain caffeine are okay to drink, but it is better to avoid caffeine. Keep your total caffeine intake to less than 200 mg each day (12 oz of coffee, tea, or soda) or as directed by your health care provider. Condiments Any pasteurized condiments. Sweets and Desserts Any sweets and desserts. Fats and Oils Any fats and oils. The items listed above may not be a complete list of recommended foods or beverages. Contact your dietitian for more options. What foods are not recommended? Vegetables Unpasteurized (raw) vegetable juices. Fruits Unpasteurized (raw) fruit juices. Meats and Other Protein Sources Cured meats that have nitrates, such as bacon, salami, and hotdogs. Luncheon meats, bologna, or other deli meats (unless they are reheated until they are steaming hot). Refrigerated pate, meat spreads from a meat counter, smoked seafood that is found in the refrigerated section of a store. Raw fish, such as sushi or sashimi. High mercury content fish, such as tilefish, shark, swordfish, and king  mackerel. Raw meats, such as tuna or beef tartare. Undercooked  meats and poultry. Make sure that all meats are cooked to food-safe temperatures. Dairy Unpasteurized (raw) milk and any foods that have raw milk in them. Soft cheeses, such as feta, queso blanco, queso fresco, Brie, Camembert cheeses, blue-veined cheeses, and Panela cheese (unless it is made with pasteurized milk, which must be stated on the label). Beverages Alcohol. Sugar-sweetened beverages, such as sodas, teas, or energy drinks. Condiments Homemade fermented foods and drinks, such as pickles, sauerkraut, or kombucha drinks. (Store-bought pasteurized versions of these are okay.) Other Salads that are made in the store, such as ham salad, chicken salad, egg salad, tuna salad, and seafood salad. The items listed above may not be a complete list of foods and beverages to avoid. Contact your dietitian for more information. This information is not intended to replace advice given to you by your health care provider. Make sure you discuss any questions you have with your health care provider. Document Released: 03/01/2014 Document Revised: 10/23/2015 Document Reviewed: 10/30/2013 Elsevier Interactive Patient Education  2018 [place]. WHAT OB PATIENTS CAN EXPECT   Confirmation of pregnancy and ultrasound ordered if medically indicated-[redacted] weeks gestation  New OB (NOB) intake with nurse and New OB (NOB) labs- [redacted] weeks gestation  New OB (NOB) physical examination with provider- 11/[redacted] weeks gestation  Flu vaccine-[redacted] weeks gestation  Anatomy scan-[redacted] weeks gestation  Glucose tolerance test, blood work to test for anemia, T-dap vaccine-[redacted] weeks gestation  Vaginal swabs/cultures-STD/Group B strep-[redacted] weeks gestation  Appointments every 4 weeks until 28 weeks  Every 2 weeks from 28 weeks until 36 weeks  Weekly visits from 36 weeks until delivery  Immunizations and Pregnancy Immunizations, or vaccines, can help to keep you healthy. They can also protect your baby from some diseases until  your baby is old enough to safely receive them. If you are pregnant or you are planning a pregnancy, the vaccines that you need are determined by:  Your age.  Your lifestyle.  Your medical history.  Your travel plans.  Your previous vaccines.  The benefits of receiving immunizations during pregnancy usually outweigh the risks:  When the risk of being exposed to a disease is high.  When infection would pose a risk to you or your unborn baby.  When the vaccine is not likely to cause harm.  Should I receive immunizations before pregnancy? If possible, make sure that your vaccines are up to date before you become pregnant. It is safe and important for you to receive weakened viral and weakened bacterial (inactivated) vaccines, as needed, before you are pregnant. Live viral and live bacterial (attenuated) vaccines should be given 1 month or more before pregnancy. Some examples of attenuated vaccines include:  Live attenuated influenza vaccine (LAIV).  Measles, mumps, and rubella (MMR).  Measles, mumps, rubella, and varicella (MMRV).  Rotavirus (RV5 or RV1).  Smallpox.  Typhoid (Ty21a, oral capsule form of the vaccine).  Varicella (VAR).  Shingles.  Yellow fever (YF).  If you become pregnant within 1 month after you have received an attenuated vaccine, contact your health care provider. Should I receive immunizations during pregnancy? It is safe and important for you to receive inactivated vaccines as needed during pregnancy. Until your baby can receive vaccines, your baby will get some protection from diseases through the vaccines that you receive while you are pregnant. However, some inactivated vaccines have not been thoroughly studied in pregnant women, and at this time, they are not recommended  during pregnancy unless the benefits outweigh the risks. One example is the pneumococcal polysaccharide vaccine (PPSV23). In addition, the human papillomavirus (HPV4 or HPV2) vaccine  is not recommended during pregnancy. You should receive inactivated influenza (IIV) and adult tetanus, diphtheria, and acellular pertussis (Tdap) vaccines during your pregnancy. The IIV, which is known as "the flu shot," will protect you and your baby (up to 30 months of age) from some complications and strains of influenza. Pregnant women can receive IIV at any time and during any trimester. The Tdap vaccine will help to prevent whooping cough (pertussis) in you and your baby. You should receive 1 dose of this vaccine during each pregnancy. It is recommended that pregnant women receive this vaccine during the 27th-36th weeks of pregnancy. Usually, attenuated vaccines are not given to pregnant women. There is a possible risk of passing the vaccine virus or bacteria to the unborn baby. If you are pregnant and you received an attenuated vaccine, contact your health care provider. Should I receive immunizations after pregnancy? It is safe and important for you to receive vaccines as needed after pregnancy. This is true even if you are breastfeeding. If you did not receive the Tdap vaccine during your pregnancy, you should receive that vaccine right after you give birth to your baby (delivery). If you are not immune to measles, mumps, rubella, or varicella, you should receive the MMR or MMRV vaccine within days after delivery. Most other vaccines are also safe to receive after pregnancy. What if I am pregnant and I plan to travel internationally? If you are pregnant and you are planning to travel internationally, talk with your health care provider at least 4-6 weeks before your trip. Discuss precautions or vaccine options. Before you receive vaccines, the risk of disease and immunization should always be determined. Immunizations that are recommended for pregnant international travelers include:  Hepatitis B (HepB).  IIV.  Tetanus and diphtheria (Td) or Tdap.  Hepatitis A (HepA).  Immunizations that  should be delayed or given only when benefits outweigh the risk of disease exposure for pregnant international travelers include:  Japanese encephalitis (JE).  Meningococcal meningitis (MPSV4 or MCV4).  PPSV23.  Inactivated polio (IPV).  Rabies.  Typhoid.  YF.  Immunizations that should not be given to pregnant international travelers include:  Tuberculosis (BCG).  MMR.  MMRV.  HPV4 or HPV2.  VAR.  LAIV.  This information is not intended to replace advice given to you by your health care provider. Make sure you discuss any questions you have with your health care provider. Document Released: 06/06/2007 Document Revised: 10/17/2015 Document Reviewed: 06/24/2014 Elsevier Interactive Patient Education  2018 [employer]. [facility]  [place], [place] [place], [place] [address]  Phone: [phone]   [facility] (second location)  [address] [place], [place] [address]  Phone: [phone]   [facility]) [place], [place], [place] [address] Phone: [phone]   [address] [place], [place] [address]  Phone: [phone]

## 2017-07-26 LAB — PROTEIN / CREATININE RATIO, URINE
Creatinine, Urine: 77.8 mg/dL
PROTEIN/CREAT RATIO: 152 mg/g{creat} (ref 0–200)
Protein, Ur: 11.8 mg/dL

## 2017-07-27 LAB — COMPREHENSIVE METABOLIC PANEL
ALT: 11 IU/L (ref 0–32)
AST: 13 IU/L (ref 0–40)
Albumin/Globulin Ratio: 1.3 (ref 1.2–2.2)
Albumin: 3.4 g/dL — ABNORMAL LOW (ref 3.5–5.5)
Alkaline Phosphatase: 76 IU/L (ref 39–117)
BUN/Creatinine Ratio: 10 (ref 9–23)
BUN: 6 mg/dL (ref 6–20)
Bilirubin Total: <0.2 mg/dL (ref 0.0–1.2)
CO2: 20 mmol/L (ref 20–29)
CREATININE: 0.58 mg/dL (ref 0.57–1.00)
Calcium: 8.6 mg/dL — ABNORMAL LOW (ref 8.7–10.2)
Chloride: 105 mmol/L (ref 96–106)
GFR calc Af Amer: 150 mL/min/{1.73_m2} (ref >59)
GFR calc non Af Amer: 130 mL/min/{1.73_m2} (ref >59)
GLOBULIN, TOTAL: 2.6 g/dL (ref 1.5–4.5)
Glucose: 96 mg/dL (ref 65–99)
Potassium: 4.4 mmol/L (ref 3.5–5.2)
SODIUM: 138 mmol/L (ref 134–144)
Total Protein: 6 g/dL (ref 6.0–8.5)

## 2017-07-27 LAB — CBC
Hematocrit: 34.9 % (ref 34.0–46.6)
Hemoglobin: 11.6 g/dL (ref 11.1–15.9)
MCH: 29.4 pg (ref 26.6–33.0)
MCHC: 33.2 g/dL (ref 31.5–35.7)
MCV: 88 fL (ref 79–97)
PLATELETS: 272 10*3/uL (ref 150–379)
RBC: 3.95 x10E6/uL (ref 3.77–5.28)
RDW: 13.5 % (ref 12.3–15.4)
WBC: 10.2 10*3/uL (ref 3.4–10.8)

## 2017-07-27 LAB — URIC ACID: Uric Acid: 3.5 mg/dL (ref 2.5–7.1)

## 2017-07-27 LAB — GLUCOSE, 1 HOUR GESTATIONAL: GESTATIONAL DIABETES SCREEN: 94 mg/dL (ref 65–139)

## 2017-07-27 LAB — RPR: RPR: NONREACTIVE

## 2017-07-31 MED ORDER — ALBUTEROL SULFATE HFA 108 (90 BASE) MCG/ACT IN AERS: 2.0000 | INHALATION_SPRAY | Freq: Four times a day (QID) | RESPIRATORY_TRACT | 2 refills | Status: DC | PRN

## 2017-07-31 NOTE — Addendum Note (Signed)
Addended by: Shaune SpittleLAWHORN, Shaquan Missey M on: 07/31/2017 06:48 PM   Modules accepted: Orders

## 2017-08-08 ENCOUNTER — Encounter: Payer: Medicaid Other | Admitting: Certified Nurse Midwife

## 2017-08-10 ENCOUNTER — Encounter: Payer: Self-pay | Admitting: Certified Nurse Midwife

## 2017-08-10 ENCOUNTER — Ambulatory Visit (INDEPENDENT_AMBULATORY_CARE_PROVIDER_SITE_OTHER): Payer: Medicaid Other | Admitting: Certified Nurse Midwife

## 2017-08-10 VITALS — BP 89/63 | HR 96 | Wt 277.4 lb

## 2017-08-10 DIAGNOSIS — Z6841 Body Mass Index (BMI) 40.0 and over, adult: Secondary | ICD-10-CM

## 2017-08-10 DIAGNOSIS — Z3403 Encounter for supervision of normal first pregnancy, third trimester: Secondary | ICD-10-CM

## 2017-08-10 DIAGNOSIS — Z8619 Personal history of other infectious and parasitic diseases: Secondary | ICD-10-CM

## 2017-08-10 LAB — POCT URINALYSIS DIPSTICK
Bilirubin, UA: NEGATIVE
Blood, UA: NEGATIVE
GLUCOSE UA: NEGATIVE
Ketones, UA: NEGATIVE
Leukocytes, UA: NEGATIVE
Nitrite, UA: NEGATIVE
SPEC GRAV UA: 1.02 (ref 1.010–1.025)
Urobilinogen, UA: 0.2 E.U./dL
pH, UA: 7 (ref 5.0–8.0)

## 2017-08-10 NOTE — Progress Notes (Signed)
ROB doing well. No complaints. The nutritionist called her she has to call them back to schedule appointment. Feels good fetal movement. Denies contractions. Discussed exercise outside of her daily work routine. She verbalizes understanding. Follow up 2 wks.   Cheryl BurkeAnnie Finnley [place], [name]

## 2017-08-10 NOTE — Progress Notes (Signed)
Pt is here for an ROB visit. 

## 2017-08-10 NOTE — Patient Instructions (Signed)

## 2017-08-24 ENCOUNTER — Ambulatory Visit (INDEPENDENT_AMBULATORY_CARE_PROVIDER_SITE_OTHER): Payer: Medicaid Other | Admitting: Obstetrics and Gynecology

## 2017-08-24 ENCOUNTER — Encounter: Payer: Self-pay | Admitting: *Deleted

## 2017-08-24 VITALS — BP 112/83 | HR 122 | Wt 279.8 lb

## 2017-08-24 DIAGNOSIS — R000 Tachycardia, unspecified: Secondary | ICD-10-CM

## 2017-08-24 DIAGNOSIS — Z3493 Encounter for supervision of normal pregnancy, unspecified, third trimester: Secondary | ICD-10-CM

## 2017-08-24 LAB — POCT URINALYSIS DIPSTICK
Bilirubin, UA: NEGATIVE
Blood, UA: NEGATIVE
KETONES UA: NEGATIVE
Leukocytes, UA: NEGATIVE
NITRITE UA: NEGATIVE
PH UA: 6.5 (ref 5.0–8.0)
PROTEIN UA: NEGATIVE
Spec Grav, UA: 1.015 (ref 1.010–1.025)
UROBILINOGEN UA: 0.2 U/dL

## 2017-08-24 NOTE — Progress Notes (Signed)
ROB-doing well,reports sudden onset central chest pain, no relationship to trauma, activity (except can't rest). Hasn't taken any med to help and nothing helped. Lungs clear, HRR but elevated- will monitor HR for next 24 hours.  Cultures next visit. BMI now 45.5- anesthesia referral placed.pt aware

## 2017-08-24 NOTE — Progress Notes (Signed)
ROB- pt is having some chest pain x 1 day

## 2017-08-25 ENCOUNTER — Other Ambulatory Visit: Payer: Self-pay | Admitting: Obstetrics and Gynecology

## 2017-08-25 ENCOUNTER — Encounter: Payer: Self-pay | Admitting: Obstetrics and Gynecology

## 2017-08-25 DIAGNOSIS — O320XX0 Maternal care for unstable lie, not applicable or unspecified: Secondary | ICD-10-CM

## 2017-08-30 ENCOUNTER — Encounter
Admission: RE | Admit: 2017-08-30 | Discharge: 2017-08-30 | Disposition: A | Discharge status: Home / Self Care | Payer: Medicaid Other | Source: Ambulatory Visit | Attending: Obstetrics and Gynecology | Admitting: Obstetrics and Gynecology

## 2017-09-07 ENCOUNTER — Encounter: Payer: Self-pay | Admitting: Certified Nurse Midwife

## 2017-09-07 ENCOUNTER — Ambulatory Visit (INDEPENDENT_AMBULATORY_CARE_PROVIDER_SITE_OTHER): Payer: Medicaid Other | Admitting: Certified Nurse Midwife

## 2017-09-07 ENCOUNTER — Ambulatory Visit (INDEPENDENT_AMBULATORY_CARE_PROVIDER_SITE_OTHER): Payer: Medicaid Other

## 2017-09-07 VITALS — BP 106/68 | HR 81 | Wt 280.1 lb

## 2017-09-07 DIAGNOSIS — O320XX0 Maternal care for unstable lie, not applicable or unspecified: Secondary | ICD-10-CM

## 2017-09-07 DIAGNOSIS — Z3493 Encounter for supervision of normal pregnancy, unspecified, third trimester: Secondary | ICD-10-CM

## 2017-09-07 LAB — POCT URINALYSIS DIPSTICK
BILIRUBIN UA: NEGATIVE
Blood, UA: NEGATIVE
GLUCOSE UA: NEGATIVE
KETONES UA: NEGATIVE
Leukocytes, UA: NEGATIVE
Nitrite, UA: NEGATIVE
Protein, UA: NEGATIVE
SPEC GRAV UA: 1.02 (ref 1.010–1.025)
Urobilinogen, UA: 0.2 E.U./dL
pH, UA: 6 (ref 5.0–8.0)

## 2017-09-07 NOTE — Progress Notes (Signed)
ROB doing well. U/s today for fetal presentation. Vertex position confirmed ( see below). Fundal height difficult to measure due to maternal body habitus. Culture collected today. EDD was incorrect and set at LMP . At early u/s EDD was changed to 10/16/17. Information reviewed with Melody and u/s reviewed. Pt to return 2 wks.   Doreene BurkeAnnie Yisroel [place], [name]   ULTRASOUND REPORT  Location: [facility] Date of Service:  09/07/2017  Indications: Presentation; Unstable fetal lie Findings:  Singleton intrauterine pregnancy is visualized with FHR at 127 BPM.   Fetal presentation is vertex.  Placenta: Anterior and grade 2. AFI: 7.5 cm.  Fetal stomach, kidneys, and bladder appear WNL.  Impression: 1. Vertex presentation.  Recommendations: 1.Clinical correlation with the patient's History and Physical Exam.   Kari BaarsJill Long, RDMS

## 2017-09-07 NOTE — Patient Instructions (Signed)
Guillain-Barre Syndrome °Guillain-Barre syndrome (GBS) is a rare disorder in which your body’s defense (immune) system attacks your nervous system. GBS is a kind of autoimmune disorder. GBS is not contagious and most people with GBS recover within a few months. However, some people may still have some weakness after a few years. °What are the causes? °The exact cause of GBS syndrome is not known. The disorder usually develops a few days or weeks after a viral infection, such as a stomach (gastrointestinal) or breathing (respiratory) infection. Sometimes surgery or vaccinations will trigger the syndrome. °What are the signs or symptoms? °The most common signs and symptoms of GBS are tingling, numbness, and weakness in your lower legs. You may have more symptoms in other areas of your body after a few weeks. Signs and symptoms may eventually include: °· Muscle weakness that spreads from your legs to your arms and trunk. °· Difficulty breathing. °· Total loss of muscle use. °· Facial weakness. °· Double vision. °· Trouble swallowing. °· Slurred speech. °· Aching or burning pain. °· Rapid breathing and heart rate. °· Flushing or cold and clammy skin. °· Dizziness when standing. °· Trouble passing urine or having bowel movements. ° °How is this diagnosed? °Your health care provider will do a physical exam to diagnose GBS. You may also have tests to confirm GBS. These may include: °· A nerve conduction velocity (NCV) test to check for slowing of nerve signals. °· A spinal tap to check for protein in the fluid around the spinal cord. ° °How is this treated? °Treatment focuses on relieving symptoms and speeding up recovery. The most important part of treatment is to keep your body functioning while your nervous system recovers. Severe cases of GBS may require hospitalization. Possible treatments include: °· Plasmapheresis. This is a type of blood transfusion. It removes immune system cells that are attacking your nervous  system. °· Intravenous injections of special proteins (immunoglobulins or antibodies). These proteins may slow down the immune system's attack on peripheral nerves. °· Medicines to control the symptoms of GBS. ° °Follow these instructions at home: °· Physical therapy may be recommended by your health care provider. Follow your exercises as directed by your physical therapist. °· Make sure you have the support you need. °· Keep all follow-up visits as directed by your health care provider. This is important. °· Take medicines only as directed by your health care provider. °Contact a health care provider if: °· You have new symptoms or your symptoms get worse. °· You do not feel safe or supported at home. °Get help right away if: °· You have trouble breathing. °· You have trouble swallowing. °· You choke after eating or drinking. °· You cannot move. °· You pass out. °This information is not intended to replace advice given to you by your health care provider. Make sure you discuss any questions you have with your health care provider. °Document Released: 05/07/2002 Document Revised: 10/23/2015 Document Reviewed: 07/18/2013 °Elsevier Interactive Patient Education © 2018 [employer] ° °

## 2017-09-07 NOTE — Progress Notes (Signed)
Pt is here for an ROB visit and is due for cultures. 

## 2017-09-09 ENCOUNTER — Encounter (INDEPENDENT_AMBULATORY_CARE_PROVIDER_SITE_OTHER): Payer: Self-pay

## 2017-09-09 LAB — GC/CHLAMYDIA PROBE AMP
CHLAMYDIA, DNA PROBE: NEGATIVE
Neisseria gonorrhoeae by PCR: NEGATIVE

## 2017-09-09 LAB — STREP GP B NAA: Strep Gp B NAA: NEGATIVE

## 2017-09-14 ENCOUNTER — Encounter: Payer: Self-pay | Admitting: Certified Nurse Midwife

## 2017-09-14 ENCOUNTER — Ambulatory Visit (INDEPENDENT_AMBULATORY_CARE_PROVIDER_SITE_OTHER): Payer: Medicaid Other | Admitting: Certified Nurse Midwife

## 2017-09-14 VITALS — BP 95/67 | HR 86 | Wt 283.2 lb

## 2017-09-14 DIAGNOSIS — Z3493 Encounter for supervision of normal pregnancy, unspecified, third trimester: Secondary | ICD-10-CM

## 2017-09-14 DIAGNOSIS — Z3A35 35 weeks gestation of pregnancy: Secondary | ICD-10-CM

## 2017-09-14 LAB — POCT URINALYSIS DIPSTICK
Bilirubin, UA: NEGATIVE
Blood, UA: NEGATIVE
GLUCOSE UA: NEGATIVE
KETONES UA: NEGATIVE
LEUKOCYTES UA: NEGATIVE
Nitrite, UA: NEGATIVE
Protein, UA: NEGATIVE
SPEC GRAV UA: 1.025 (ref 1.010–1.025)
Urobilinogen, UA: 0.2 E.U./dL
pH, UA: 6.5 (ref 5.0–8.0)

## 2017-09-14 MED ORDER — VALACYCLOVIR HCL 1 G PO TABS: 1000.0000 mg | ORAL_TABLET | Freq: Every day | ORAL | 3 refills | Status: DC

## 2017-09-14 NOTE — Patient Instructions (Addendum)
Braxton Hicks Contractions °Contractions of the uterus can occur throughout pregnancy, but they are not always a sign that you are in labor. You may have practice contractions called Braxton Hicks contractions. These false labor contractions are sometimes confused with true labor. °What are Braxton Hicks contractions? °Braxton Hicks contractions are tightening movements that occur in the muscles of the uterus before labor. Unlike true labor contractions, these contractions do not result in opening (dilation) and thinning of the cervix. Toward the end of pregnancy (32-34 weeks), Braxton Hicks contractions can happen more often and may become stronger. These contractions are sometimes difficult to tell apart from true labor because they can be very uncomfortable. You should not feel embarrassed if you go to the hospital with false labor. °Sometimes, the only way to tell if you are in true labor is for your health care provider to look for changes in the cervix. The health care provider will do a physical exam and may monitor your contractions. If you are not in true labor, the exam should show that your cervix is not dilating and your water has not broken. °If there are other health problems associated with your pregnancy, it is completely safe for you to be sent home with false labor. You may continue to have Braxton Hicks contractions until you go into true labor. °How to tell the difference between true labor and false labor °True labor °· Contractions last 30-70 seconds. °· Contractions become very regular. °· Discomfort is usually felt in the top of the uterus, and it spreads to the lower abdomen and low back. °· Contractions do not go away with walking. °· Contractions usually become more intense and increase in frequency. °· The cervix dilates and gets thinner. °False labor °· Contractions are usually shorter and not as strong as true labor contractions. °· Contractions are usually irregular. °· Contractions  are often felt in the front of the lower abdomen and in the groin. °· Contractions may go away when you walk around or change positions while lying down. °· Contractions get weaker and are shorter-lasting as time goes on. °· The cervix usually does not dilate or become thin. °Follow these instructions at home: °· Take over-the-counter and prescription medicines only as told by your health care provider. °· Keep up with your usual exercises and follow other instructions from your health care provider. °· Eat and drink lightly if you think you are going into labor. °· If Braxton Hicks contractions are making you uncomfortable: °? Change your position from lying down or resting to walking, or change from walking to resting. °? Sit and rest in a tub of warm water. °? Drink enough fluid to keep your urine pale yellow. Dehydration may cause these contractions. °? Do slow and deep breathing several times an hour. °· Keep all follow-up prenatal visits as told by your health care provider. This is important. °Contact a health care provider if: °· You have a fever. °· You have continuous pain in your abdomen. °Get help right away if: °· Your contractions become stronger, more regular, and closer together. °· You have fluid leaking or gushing from your vagina. °· You pass blood-tinged mucus (bloody show). °· You have bleeding from your vagina. °· You have low back pain that you never had before. °· You feel your baby’s head pushing down and causing pelvic pressure. °· Your baby is not moving inside you as much as it used to. °Summary °· Contractions that occur before labor are called Braxton   Hicks contractions, false labor, or practice contractions. °· Braxton Hicks contractions are usually shorter, weaker, farther apart, and less regular than true labor contractions. True labor contractions usually become progressively stronger and regular and they become more frequent. °· Manage discomfort from Braxton Hicks contractions by  changing position, resting in a warm bath, drinking plenty of water, or practicing deep breathing. °This information is not intended to replace advice given to you by your health care provider. Make sure you discuss any questions you have with your health care provider. °Document Released: 09/30/2016 Document Revised: 09/30/2016 Document Reviewed: 09/30/2016 °Elsevier Interactive Patient Education © 2018 Elsevier Inc. ° °

## 2017-09-14 NOTE — Progress Notes (Signed)
Patient reports doing well, reports one contraction last week. PTL precautions discussed. Fundal height difficult to measure due to maternal body habitus. 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 Patient verbalizes understanding and agreement.  Follow up in 1.5 weeks for ROB.  Shanika Creacy,SNM/Mykal Kirchman,[name]

## 2017-09-14 NOTE — Addendum Note (Signed)
Addended by: Brooke DareSICK, Mahaila Tischer L on: 09/14/2017 04:06 PM   Modules accepted: Orders

## 2017-09-14 NOTE — Progress Notes (Signed)
Pt is here for an ROB visit. 

## 2017-09-21 ENCOUNTER — Encounter: Payer: Medicaid Other | Admitting: Obstetrics and Gynecology

## 2017-09-23 ENCOUNTER — Ambulatory Visit (INDEPENDENT_AMBULATORY_CARE_PROVIDER_SITE_OTHER): Payer: Medicaid Other | Admitting: Obstetrics and Gynecology

## 2017-09-23 VITALS — BP 102/72 | HR 91 | Wt 285.1 lb

## 2017-09-23 DIAGNOSIS — Z3493 Encounter for supervision of normal pregnancy, unspecified, third trimester: Secondary | ICD-10-CM | POA: Diagnosis not present

## 2017-09-23 LAB — POCT URINALYSIS DIPSTICK
Bilirubin, UA: NEGATIVE
Blood, UA: NEGATIVE
GLUCOSE UA: NEGATIVE
KETONES UA: NEGATIVE
LEUKOCYTES UA: NEGATIVE
Nitrite, UA: NEGATIVE
Protein, UA: NEGATIVE
SPEC GRAV UA: 1.015 (ref 1.010–1.025)
Urobilinogen, UA: 0.2 E.U./dL
pH, UA: 6 (ref 5.0–8.0)

## 2017-09-23 NOTE — Progress Notes (Signed)
ROB- pt c/o b feet swelling, otherwise she is doing well

## 2017-09-23 NOTE — Progress Notes (Signed)
ROB- reviewed negative cultures.encouraged elevating feet when she can. Plans to work until May 13th unless delivers before then.

## 2017-09-30 ENCOUNTER — Ambulatory Visit (INDEPENDENT_AMBULATORY_CARE_PROVIDER_SITE_OTHER): Payer: Medicaid Other | Admitting: Certified Nurse Midwife

## 2017-09-30 VITALS — BP 103/83 | HR 94 | Wt 284.2 lb

## 2017-09-30 DIAGNOSIS — Z3493 Encounter for supervision of normal pregnancy, unspecified, third trimester: Secondary | ICD-10-CM | POA: Diagnosis not present

## 2017-09-30 LAB — POCT URINALYSIS DIPSTICK
BILIRUBIN UA: NEGATIVE
GLUCOSE UA: NEGATIVE
Ketones, UA: NEGATIVE
LEUKOCYTES UA: NEGATIVE
Nitrite, UA: NEGATIVE
PH UA: 6.5 (ref 5.0–8.0)
Protein, UA: NEGATIVE
RBC UA: NEGATIVE
SPEC GRAV UA: 1.02 (ref 1.010–1.025)
Urobilinogen, UA: 0.2 E.U./dL

## 2017-09-30 NOTE — Patient Instructions (Signed)
Labor Induction Labor induction is when steps are taken to cause a pregnant woman to begin the labor process. Most women go into labor on their own between 37 weeks and 42 weeks of the pregnancy. When this does not happen or when there is a medical need, methods may be used to induce labor. Labor induction causes a pregnant woman's uterus to contract. It also causes the cervix to soften (ripen), open (dilate), and thin out (efface). Usually, labor is not induced before 39 weeks of the pregnancy unless there is a problem with the baby or mother. Before inducing labor, your health care provider will consider a number of factors, including the following:  The medical condition of you and the baby.  How many weeks along you are.  The status of the baby's lung maturity.  The condition of the cervix.  The position of the baby. What are the reasons for labor induction? Labor may be induced for the following reasons:  The health of the baby or mother is at risk.  The pregnancy is overdue by 1 week or more.  The water breaks but labor does not start on its own.  The mother has a health condition or serious illness, such as high blood pressure, infection, placental abruption, or diabetes.  The amniotic fluid amounts are low around the baby.  The baby is distressed. Convenience or wanting the baby to be born on a certain date is not a reason for inducing labor. What methods are used for labor induction? Several methods of labor induction may be used, such as:  Prostaglandin medicine. This medicine causes the cervix to dilate and ripen. The medicine will also start contractions. It can be taken by mouth or by inserting a suppository into the vagina.  Inserting a thin tube (catheter) with a balloon on the end into the vagina to dilate the cervix. Once inserted, the balloon is expanded with water, which causes the cervix to open.  Stripping the membranes. Your health care provider separates  amniotic sac tissue from the cervix, causing the cervix to be stretched and causing the release of a hormone called progesterone. This may cause the uterus to contract. It is often done during an office visit. You will be sent home to wait for the contractions to begin. You will then come in for an induction.  Breaking the water. Your health care provider makes a hole in the amniotic sac using a small instrument. Once the amniotic sac breaks, contractions should begin. This may still take hours to see an effect.  Medicine to trigger or strengthen contractions. This medicine is given through an IV access tube inserted into a vein in your arm. All of the methods of induction, besides stripping the membranes, will be done in the hospital. Induction is done in the hospital so that you and the baby can be carefully monitored. How long does it take for labor to be induced? Some inductions can take up to 2-3 days. Depending on the cervix, it usually takes less time. It takes longer when you are induced early in the pregnancy or if this is your first pregnancy. If a mother is still pregnant and the induction has been going on for 2-3 days, either the mother will be sent home or a cesarean delivery will be needed. What are the risks associated with labor induction? Some of the risks of induction include:  Changes in fetal heart rate, such as too high, too low, or erratic.  Fetal distress.    Chance of infection for the mother and baby.  Increased chance of having a cesarean delivery.  Breaking off (abruption) of the placenta from the uterus (rare).  Uterine rupture (very rare). When induction is needed for medical reasons, the benefits of induction may outweigh the risks. What are some reasons for not inducing labor? Labor induction should not be done if:  It is shown that your baby does not tolerate labor.  You have had previous surgeries on your uterus, such as a myomectomy or the removal of  fibroids.  Your placenta lies very low in the uterus and blocks the opening of the cervix (placenta previa).  Your baby is not in a head-down position.  The umbilical cord drops down into the birth canal in front of the baby. This could cut off the baby's blood and oxygen supply.  You have had a previous cesarean delivery.  There are unusual circumstances, such as the baby being extremely premature. This information is not intended to replace advice given to you by your health care provider. Make sure you discuss any questions you have with your health care provider. Document Released: 10/06/2006 Document Revised: 10/23/2015 Document Reviewed: 12/14/2012 Elsevier Interactive Patient Education  2017 Elsevier Inc.  

## 2017-09-30 NOTE — Progress Notes (Signed)
Pt is here for an ROB visit. Had some spotting after intercourse.

## 2017-09-30 NOTE — Progress Notes (Signed)
ROB-Doing well. Current Body mass index is 46.58 kg/m. Discussed IOL during 39 week due to morbid obesity. Education regarding cervical ripening and induction options; see AVS. SVE unchanged since last visit. Reviewed red flag symptoms and when to call. RTC x 1 week for ROB. IOL scheduled 10/11/2017 at 0500, patient notified via MyChart. Orders singed and held, JML.

## 2017-10-03 ENCOUNTER — Encounter (INDEPENDENT_AMBULATORY_CARE_PROVIDER_SITE_OTHER): Payer: Self-pay

## 2017-10-05 ENCOUNTER — Encounter: Payer: Self-pay | Admitting: Certified Nurse Midwife

## 2017-10-05 ENCOUNTER — Ambulatory Visit (INDEPENDENT_AMBULATORY_CARE_PROVIDER_SITE_OTHER): Payer: Medicaid Other | Admitting: Certified Nurse Midwife

## 2017-10-05 VITALS — BP 103/78 | HR 124 | Wt 289.4 lb

## 2017-10-05 DIAGNOSIS — Z3493 Encounter for supervision of normal pregnancy, unspecified, third trimester: Secondary | ICD-10-CM | POA: Diagnosis not present

## 2017-10-05 LAB — POCT URINALYSIS DIPSTICK
BILIRUBIN UA: NEGATIVE
Blood, UA: NEGATIVE
Glucose, UA: NEGATIVE
KETONES UA: NEGATIVE
Leukocytes, UA: NEGATIVE
NITRITE UA: NEGATIVE
PH UA: 6 (ref 5.0–8.0)
SPEC GRAV UA: 1.025 (ref 1.010–1.025)
UROBILINOGEN UA: 0.2 U/dL

## 2017-10-05 NOTE — Progress Notes (Signed)
ROB,doing well, good FM. Discussed schedule IOL on 10/11/17 for obesity at 39 weeks at 5 am. Discussed labor induction procedure and anticipatory guidance for labor induction. Follow up [facility] for labor induction Tuesday.  Shanika Creacy,SNM/Salihah Peckham,[name]

## 2017-10-05 NOTE — Progress Notes (Signed)
Pt is here for an ROB visit. Pain in legs and feet.

## 2017-10-05 NOTE — Patient Instructions (Signed)
Labor Induction Labor induction is when steps are taken to cause a pregnant woman to begin the labor process. Most women go into labor on their own between 37 weeks and 42 weeks of the pregnancy. When this does not happen or when there is a medical need, methods may be used to induce labor. Labor induction causes a pregnant woman's uterus to contract. It also causes the cervix to soften (ripen), open (dilate), and thin out (efface). Usually, labor is not induced before 39 weeks of the pregnancy unless there is a problem with the baby or mother. Before inducing labor, your health care provider will consider a number of factors, including the following:  The medical condition of you and the baby.  How many weeks along you are.  The status of the baby's lung maturity.  The condition of the cervix.  The position of the baby. What are the reasons for labor induction? Labor may be induced for the following reasons:  The health of the baby or mother is at risk.  The pregnancy is overdue by 1 week or more.  The water breaks but labor does not start on its own.  The mother has a health condition or serious illness, such as high blood pressure, infection, placental abruption, or diabetes.  The amniotic fluid amounts are low around the baby.  The baby is distressed. Convenience or wanting the baby to be born on a certain date is not a reason for inducing labor. What methods are used for labor induction? Several methods of labor induction may be used, such as:  Prostaglandin medicine. This medicine causes the cervix to dilate and ripen. The medicine will also start contractions. It can be taken by mouth or by inserting a suppository into the vagina.  Inserting a thin tube (catheter) with a balloon on the end into the vagina to dilate the cervix. Once inserted, the balloon is expanded with water, which causes the cervix to open.  Stripping the membranes. Your health care provider separates  amniotic sac tissue from the cervix, causing the cervix to be stretched and causing the release of a hormone called progesterone. This may cause the uterus to contract. It is often done during an office visit. You will be sent home to wait for the contractions to begin. You will then come in for an induction.  Breaking the water. Your health care provider makes a hole in the amniotic sac using a small instrument. Once the amniotic sac breaks, contractions should begin. This may still take hours to see an effect.  Medicine to trigger or strengthen contractions. This medicine is given through an IV access tube inserted into a vein in your arm. All of the methods of induction, besides stripping the membranes, will be done in the hospital. Induction is done in the hospital so that you and the baby can be carefully monitored. How long does it take for labor to be induced? Some inductions can take up to 2-3 days. Depending on the cervix, it usually takes less time. It takes longer when you are induced early in the pregnancy or if this is your first pregnancy. If a mother is still pregnant and the induction has been going on for 2-3 days, either the mother will be sent home or a cesarean delivery will be needed. What are the risks associated with labor induction? Some of the risks of induction include:  Changes in fetal heart rate, such as too high, too low, or erratic.  Fetal distress.    Chance of infection for the mother and baby.  Increased chance of having a cesarean delivery.  Breaking off (abruption) of the placenta from the uterus (rare).  Uterine rupture (very rare). When induction is needed for medical reasons, the benefits of induction may outweigh the risks. What are some reasons for not inducing labor? Labor induction should not be done if:  It is shown that your baby does not tolerate labor.  You have had previous surgeries on your uterus, such as a myomectomy or the removal of  fibroids.  Your placenta lies very low in the uterus and blocks the opening of the cervix (placenta previa).  Your baby is not in a head-down position.  The umbilical cord drops down into the birth canal in front of the baby. This could cut off the baby's blood and oxygen supply.  You have had a previous cesarean delivery.  There are unusual circumstances, such as the baby being extremely premature. This information is not intended to replace advice given to you by your health care provider. Make sure you discuss any questions you have with your health care provider. Document Released: 10/06/2006 Document Revised: 10/23/2015 Document Reviewed: 12/14/2012 Elsevier Interactive Patient Education  2017 Elsevier Inc.  

## 2017-10-11 ENCOUNTER — Other Ambulatory Visit: Payer: Self-pay

## 2017-10-11 ENCOUNTER — Inpatient Hospital Stay: Payer: Medicaid Other | Admitting: Certified Registered Nurse Anesthetist

## 2017-10-11 ENCOUNTER — Inpatient Hospital Stay
Admission: EM | Admit: 2017-10-11 | Discharge: 2017-10-13 | DRG: 806 | Disposition: A | Discharge status: Home / Self Care | Payer: Medicaid Other | Source: Intra-hospital | Attending: Certified Nurse Midwife | Admitting: Certified Nurse Midwife

## 2017-10-11 DIAGNOSIS — O99214 Obesity complicating childbirth: Secondary | ICD-10-CM | POA: Diagnosis present

## 2017-10-11 DIAGNOSIS — Z3A39 39 weeks gestation of pregnancy: Secondary | ICD-10-CM | POA: Diagnosis not present

## 2017-10-11 DIAGNOSIS — Z3490 Encounter for supervision of normal pregnancy, unspecified, unspecified trimester: Secondary | ICD-10-CM | POA: Diagnosis present

## 2017-10-11 DIAGNOSIS — Z6791 Unspecified blood type, Rh negative: Secondary | ICD-10-CM | POA: Diagnosis not present

## 2017-10-11 DIAGNOSIS — O26893 Other specified pregnancy related conditions, third trimester: Secondary | ICD-10-CM | POA: Diagnosis present

## 2017-10-11 DIAGNOSIS — Z87891 Personal history of nicotine dependence: Secondary | ICD-10-CM

## 2017-10-11 DIAGNOSIS — O9832 Other infections with a predominantly sexual mode of transmission complicating childbirth: Secondary | ICD-10-CM | POA: Diagnosis present

## 2017-10-11 DIAGNOSIS — A6000 Herpesviral infection of urogenital system, unspecified: Secondary | ICD-10-CM | POA: Diagnosis present

## 2017-10-11 LAB — CBC
HCT: 34.8 % — ABNORMAL LOW (ref 35.0–47.0)
HEMOGLOBIN: 11.9 g/dL — AB (ref 12.0–16.0)
MCH: 29 pg (ref 26.0–34.0)
MCHC: 34.2 g/dL (ref 32.0–36.0)
MCV: 84.9 fL (ref 80.0–100.0)
Platelets: 285 10*3/uL (ref 150–440)
RBC: 4.1 MIL/uL (ref 3.80–5.20)
RDW: 15.7 % — ABNORMAL HIGH (ref 11.5–14.5)
WBC: 11.3 10*3/uL — ABNORMAL HIGH (ref 3.6–11.0)

## 2017-10-11 LAB — COMPREHENSIVE METABOLIC PANEL
ALBUMIN: 3.1 g/dL — AB (ref 3.5–5.0)
ALK PHOS: 125 U/L (ref 38–126)
ALT: 15 U/L (ref 14–54)
AST: 25 U/L (ref 15–41)
Anion gap: 6 (ref 5–15)
BUN: 8 mg/dL (ref 6–20)
CALCIUM: 9.3 mg/dL (ref 8.9–10.3)
CO2: 22 mmol/L (ref 22–32)
CREATININE: 0.55 mg/dL (ref 0.44–1.00)
Chloride: 106 mmol/L (ref 101–111)
GFR calc Af Amer: >60 mL/min (ref >60)
GFR calc non Af Amer: >60 mL/min (ref >60)
GLUCOSE: 95 mg/dL (ref 65–99)
Potassium: 3.9 mmol/L (ref 3.5–5.1)
Sodium: 134 mmol/L — ABNORMAL LOW (ref 135–145)
Total Bilirubin: 0.4 mg/dL (ref 0.3–1.2)
Total Protein: 6.8 g/dL (ref 6.5–8.1)

## 2017-10-11 LAB — ABO/RH: ABO/RH(D): A NEG

## 2017-10-11 MED ORDER — OXYCODONE-ACETAMINOPHEN 5-325 MG PO TABS: 1.0000 | ORAL_TABLET | ORAL | Status: DC | PRN

## 2017-10-11 MED ORDER — OXYTOCIN 10 UNIT/ML IJ SOLN
INTRAMUSCULAR | Status: AC
  Filled 2017-10-11: qty 2

## 2017-10-11 MED ORDER — ZOLPIDEM TARTRATE 5 MG PO TABS: 5.0000 mg | ORAL_TABLET | Freq: Every evening | ORAL | Status: DC | PRN

## 2017-10-11 MED ORDER — OXYTOCIN 40 UNITS IN LACTATED RINGERS INFUSION - SIMPLE MED
2.5000 [IU]/h | INTRAVENOUS | Status: DC
  Administered 2017-10-11 – 2017-10-12 (×2): 2.5 [IU]/h via INTRAVENOUS
  Filled 2017-10-11: qty 1000

## 2017-10-11 MED ORDER — FLEET ENEMA 7-19 GM/118ML RE ENEM
1.0000 | ENEMA | Freq: Every day | RECTAL | Status: DC | PRN
Start: 2017-10-11 — End: 2017-10-12

## 2017-10-11 MED ORDER — BUTORPHANOL TARTRATE 1 MG/ML IJ SOLN: 1.0000 mg | INTRAMUSCULAR | Status: DC | PRN

## 2017-10-11 MED ORDER — SOD CITRATE-CITRIC ACID 500-334 MG/5ML PO SOLN: 30.0000 mL | ORAL | Status: DC | PRN

## 2017-10-11 MED ORDER — MISOPROSTOL 25 MCG QUARTER TABLET
50.0000 ug | ORAL_TABLET | ORAL | Status: DC
  Administered 2017-10-11: 50 ug via VAGINAL
  Filled 2017-10-11 (×2): qty 1

## 2017-10-11 MED ORDER — MISOPROSTOL 200 MCG PO TABS
ORAL_TABLET | ORAL | Status: AC
  Administered 2017-10-11: 800 ug
  Filled 2017-10-11: qty 4

## 2017-10-11 MED ORDER — FENTANYL 2.5 MCG/ML W/ROPIVACAINE 0.15% IN NS 100 ML EPIDURAL (ARMC)
EPIDURAL | Status: AC
  Filled 2017-10-11: qty 100

## 2017-10-11 MED ORDER — LIDOCAINE-EPINEPHRINE (PF) 1.5 %-1:200000 IJ SOLN
INTRAMUSCULAR | Status: DC | PRN
  Administered 2017-10-11 (×2): 3 mL via EPIDURAL

## 2017-10-11 MED ORDER — LIDOCAINE HCL (PF) 1 % IJ SOLN
INTRAMUSCULAR | Status: AC
  Filled 2017-10-11: qty 30

## 2017-10-11 MED ORDER — TERBUTALINE SULFATE 1 MG/ML IJ SOLN: 0.2500 mg | Freq: Once | INTRAMUSCULAR | Status: DC | PRN

## 2017-10-11 MED ORDER — MISOPROSTOL 200 MCG PO TABS: 800.0000 ug | ORAL_TABLET | Freq: Once | ORAL | Status: AC

## 2017-10-11 MED ORDER — OXYTOCIN 40 UNITS IN LACTATED RINGERS INFUSION - SIMPLE MED: 1.0000 m[IU]/min | INTRAVENOUS | Status: DC

## 2017-10-11 MED ORDER — OXYCODONE-ACETAMINOPHEN 5-325 MG PO TABS: 2.0000 | ORAL_TABLET | ORAL | Status: DC | PRN

## 2017-10-11 MED ORDER — AMMONIA AROMATIC IN INHA
RESPIRATORY_TRACT | Status: AC
  Filled 2017-10-11: qty 10

## 2017-10-11 MED ORDER — LIDOCAINE HCL (PF) 1 % IJ SOLN
INTRAMUSCULAR | Status: DC | PRN
  Administered 2017-10-11: 3 mL via SUBCUTANEOUS
  Administered 2017-10-11: 3 mL

## 2017-10-11 MED ORDER — METHYLERGONOVINE MALEATE 0.2 MG/ML IJ SOLN
INTRAMUSCULAR | Status: AC
  Administered 2017-10-11: 0.2 mg
  Filled 2017-10-11: qty 1

## 2017-10-11 MED ORDER — FENTANYL 2.5 MCG/ML W/ROPIVACAINE 0.15% IN NS 100 ML EPIDURAL (ARMC)
EPIDURAL | Status: DC | PRN
  Administered 2017-10-11: 12 mL/h via EPIDURAL

## 2017-10-11 MED ORDER — ONDANSETRON HCL 4 MG/2ML IJ SOLN: 4.0000 mg | Freq: Four times a day (QID) | INTRAMUSCULAR | Status: DC | PRN

## 2017-10-11 MED ORDER — LIDOCAINE HCL (PF) 1 % IJ SOLN: 30.0000 mL | INTRAMUSCULAR | Status: DC | PRN

## 2017-10-11 MED ORDER — ACETAMINOPHEN 325 MG PO TABS: 650.0000 mg | ORAL_TABLET | ORAL | Status: DC | PRN

## 2017-10-11 MED ORDER — LACTATED RINGERS IV SOLN
500.0000 mL | INTRAVENOUS | Status: DC | PRN
  Administered 2017-10-11 (×2): 500 mL via INTRAVENOUS

## 2017-10-11 MED ORDER — OXYTOCIN 40 UNITS IN LACTATED RINGERS INFUSION - SIMPLE MED
INTRAVENOUS | Status: AC
  Filled 2017-10-11: qty 1000

## 2017-10-11 MED ORDER — OXYTOCIN 40 UNITS IN LACTATED RINGERS INFUSION - SIMPLE MED
1.0000 m[IU]/min | INTRAVENOUS | Status: DC
  Administered 2017-10-11: 2 m[IU]/min via INTRAVENOUS

## 2017-10-11 MED ORDER — LACTATED RINGERS IV SOLN
INTRAVENOUS | Status: DC
  Administered 2017-10-11 (×2): via INTRAVENOUS

## 2017-10-11 MED ORDER — OXYTOCIN BOLUS FROM INFUSION
500.0000 mL | Freq: Once | INTRAVENOUS | Status: AC
  Administered 2017-10-11: 500 mL via INTRAVENOUS

## 2017-10-11 MED ORDER — BUPIVACAINE HCL (PF) 0.25 % IJ SOLN
INTRAMUSCULAR | Status: DC | PRN
  Administered 2017-10-11 (×4): 4 mL via EPIDURAL

## 2017-10-11 NOTE — Anesthesia Preprocedure Evaluation (Addendum)
Anesthesia Evaluation  Patient identified by MRN, date of birth, ID band  Reviewed: Allergy & Precautions, NPO status , Patient's Chart, lab work & pertinent test results  Airway Mallampati: II   Neck ROM: Full    Dental   Pulmonary former smoker,           Cardiovascular      Neuro/Psych    GI/Hepatic GERD  ,  Endo/Other  Morbid obesity  Renal/GU      Musculoskeletal   Abdominal   Peds  Hematology   Anesthesia Other Findings   Reproductive/Obstetrics (+) Pregnancy                            Anesthesia Physical Anesthesia Plan  ASA: II  Anesthesia Plan: Epidural   Post-op Pain Management:    Induction:   PONV Risk Score and Plan:   Airway Management Planned:   Additional Equipment:   Intra-op Plan:   Post-operative Plan:   Informed Consent:   Plan Discussed with:   Anesthesia Plan Comments:         Anesthesia Quick Evaluation

## 2017-10-11 NOTE — Anesthesia Procedure Notes (Signed)
Epidural Patient location during procedure: OB Start time: 10/11/2017 12:51 PM End time: 10/11/2017 1:14 PM  Staffing Anesthesiologist: Alver Fisher, MD Resident/CRNA: Malva Cogan, CRNA Performed: resident/CRNA   Preanesthetic Checklist Completed: patient identified, site marked, surgical consent, pre-op evaluation, timeout performed, IV checked, risks and benefits discussed and monitors and equipment checked  Epidural Patient position: sitting Prep: Betadine Patient monitoring: heart rate, continuous pulse ox and blood pressure Approach: midline Location: L3-L4 Injection technique: LOR saline  Needle:  Needle type: Tuohy  Needle gauge: 17 G Needle length: 9 cm and 9 Needle insertion depth: 7 cm Catheter type: closed end flexible Catheter size: 19 Gauge Catheter at skin depth: 11 cm Test dose: negative and 1.5% lidocaine with Epi 1:200 K  Assessment Sensory level: T10 Events: blood not aspirated, injection not painful, no injection resistance, negative IV test and no paresthesia  Additional Notes 1st attempt Pt. Evaluated and documentation done after procedure finished. Patient identified. Risks/Benefits/Options discussed with patient including but not limited to bleeding, infection, nerve damage, paralysis, failed block, incomplete pain control, headache, blood pressure changes, nausea, vomiting, reactions to medication both or allergic, itching and postpartum back pain. Confirmed with bedside nurse the patient's most recent platelet count. Confirmed with patient that they are not currently taking any anticoagulation, have any bleeding history or any family history of bleeding disorders. Patient expressed understanding and wished to proceed. All questions were answered. Sterile technique was used throughout the entire procedure. Please see nursing notes for vital signs. Test dose was given through epidural catheter and negative prior to continuing to dose epidural or start  infusion. Warning signs of high block given to the patient including shortness of breath, tingling/numbness in hands, complete motor block, or any concerning symptoms with instructions to call for help. Patient was given instructions on fall risk and not to get out of bed. All questions and concerns addressed with instructions to call with any issues or inadequate analgesia.   Patient tolerated the insertion well without immediate complications.Reason for block:procedure for pain

## 2017-10-11 NOTE — H&P (Signed)
History and Physical   HPI  Cheryl Mills is a 22 y.o. G1P0 at [redacted]w[redacted]d Estimated Date of Delivery: 10/16/17 who is being admitted for induction of labor due to BMI greater than 40.    OB History  OB History  Gravida Para Term Preterm AB Living  1 0 0 0 0 0  SAB TAB Ectopic Multiple Live Births  0 0 0 0 0    # Outcome Date GA Lbr Len/2nd Weight Sex Delivery Anes PTL Lv  1 Current             PROBLEM LIST  Pregnancy complications or risks: Patient Active Problem List   Diagnosis Date Noted  . Encounter for induction of labor 10/11/2017  . BMI 40.0-44.9, adult (HCC) 03/30/2017  . Rh negative state in antepartum period 03/09/2017  . History of chlamydia 03/09/2017    Prenatal labs and studies: ABO, Rh: --/--/A NEG (05/14 1478) Antibody: POS (05/14 2956) Rubella: 1.32 (10/08 1604) RPR: Non Reactive (02/25 1156)  HBsAg: Negative (10/08 1604)  HIV: Non Reactive (10/08 1604)  OZH:YQMVHQIO (04/10 1624)   Past Medical History:  Diagnosis Date  . 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 Missed menses   . PCOS (polycystic ovarian syndrome)      Past Surgical History:  Procedure Laterality Date  . TYMPANOSTOMY TUBE PLACEMENT       Medications    Current Discharge Medication List    CONTINUE these medications which have NOT CHANGED   Details  Prenatal Vit-Fe Fumarate-FA (MULTIVITAMIN-PRENATAL) 27-0.8 MG TABS tablet Take 1 tablet by mouth daily at 12 noon.    valACYclovir (VALTREX) 1000 MG tablet Take 1 tablet (1,000 mg total) by mouth daily. Qty: 30 tablet, Refills: 3         Allergies  Patient has no known allergies.  Review of Systems  Constitutional: negative Eyes: negative Ears, nose, mouth, throat, and face: negative Respiratory: negative Cardiovascular: negative Gastrointestinal: negative Genitourinary:negative Integument/breast: negative Hematologic/lymphatic: negative Musculoskeletal:negative Neurological:  negative Behavioral/Psych: negative Endocrine: negative Allergic/Immunologic: negative  Physical Exam  BP 129/88 (BP Location: Left Arm)   Pulse 84   Temp 98 F (36.7 C) (Oral)   Resp 16   Ht  (1.676 m)   Wt 289 lb (131.1 kg)   BMI 46.65 kg/m   Lungs:  CTA Bilaterally  Cardio: RRR  Abd: Soft, gravid, NT Presentation: cephalic EXT: No C/C/ 2+ Edema CERVIX: not evaluated   See Prenatal records for more detailed PE.     FHR:  Baseline: 145 bpm, Variability: Good {> 6 bpm), Accelerations: Reactive and Decelerations: Absent  Toco: Uterine Contractions: None   Test Results  Results for orders placed or performed during the hospital encounter of 10/11/17 (from the past 24 hour(s))  CBC     Status: Abnormal   Collection Time: 10/11/17  6:19 AM  Result Value Ref Range   WBC 11.3 (H) 3.6 - 11.0 K/uL   RBC 4.10 3.80 - 5.20 MIL/uL   Hemoglobin 11.9 (L) 12.0 - 16.0 g/dL   HCT 96.2 (L) 95.2 - 84.1 %   MCV 84.9 80.0 - 100.0 fL   MCH 29.0 26.0 - 34.0 pg   MCHC 34.2 32.0 - 36.0 g/dL   RDW 32.4 (H) 40.1 - 02.7 %   Platelets 285 150 - 440 K/uL  Comprehensive metabolic panel     Status: Abnormal   Collection Time: 10/11/17  6:19 AM  Result Value Ref Range  Sodium 134 (L) 135 - 145 mmol/L   Potassium 3.9 3.5 - 5.1 mmol/L   Chloride 106 101 - 111 mmol/L   CO2 22 22 - 32 mmol/L   Glucose, Bld 95 65 - 99 mg/dL   BUN 8 6 - 20 mg/dL   Creatinine, Ser 4.09 0.44 - 1.00 mg/dL   Calcium 9.3 8.9 - 81.1 mg/dL   Total Protein 6.8 6.5 - 8.1 g/dL   Albumin 3.1 (L) 3.5 - 5.0 g/dL   AST 25 15 - 41 U/L   ALT 15 14 - 54 U/L   Alkaline Phosphatase 125 38 - 126 U/L   Total Bilirubin 0.4 0.3 - 1.2 mg/dL   GFR calc non Af Amer >60 >60 mL/min   GFR calc Af Amer >60 >60 mL/min   Anion gap 6 5 - 15  Type and screen     Status: None (Preliminary result)   Collection Time: 10/11/17  6:19 AM  Result Value Ref Range   ABO/RH(D) A NEG    Antibody Screen POS    Sample Expiration       10/14/2017 Performed at [facility], [address] [place], [place] [address]    Group B Strep negative  Assessment   G1P0 at [redacted]w[redacted]d Estimated Date of Delivery: 10/16/17  The fetus is reassuring.   Patient Active Problem List   Diagnosis Date Noted  . Encounter for induction of labor 10/11/2017  . BMI 40.0-44.9, adult (HCC) 03/30/2017  . Rh negative state in antepartum period 03/09/2017  . History of chlamydia 03/09/2017    Plan  1. Admit to L&D :   Cytotec  2. EFM:-- Category 1 3. Epidural if desired. Stadol for IV pain until epidural requested. 4. Admission labs  5. Anticipate NSVD  Doreene Burke, [name]  10/11/2017 8:03 AM

## 2017-10-11 NOTE — Progress Notes (Signed)
LABOR NOTE   Cheryl Mills 22 y.o.GP@ at [redacted]w[redacted]d Early latent labor.  SUBJECTIVE:  Patient is increasingly uncomfortable, cramping and requesting epidural.  OBJECTIVE:  BP 126/90 (BP Location: Left Arm)   Pulse 94   Temp 97.7 F (36.5 C) (Oral)   Resp 16   Ht  (1.676 m)   Wt 289 lb (131.1 kg)   BMI 46.65 kg/m  Total I/O In: 500 [I.V.:500] Out: -   She has shown cervical change. CERVIX: 4:  70%:   -2:   mid position:   soft SVE:   Dilation: 4 Effacement (%): 70 Station: -2, -1 Exam by:: [name], [name] CONTRACTIONS: regular, every 1-3 minutes FHR: Fetal heart tracing reviewed. Baseline: 135 bpm, Variability: Good {> 6 bpm), Accelerations: Reactive and Decelerations: Absent Category I Analgesia: Requesting epidural  Labs: Lab Results  Component Value Date   WBC 11.3 (H) 10/11/2017   HGB 11.9 (L) 10/11/2017   HCT 34.8 (L) 10/11/2017   MCV 84.9 10/11/2017   PLT 285 10/11/2017    ASSESSMENT: 1) Labor curve reviewed.       Progress: Early latent labor. and Satisfactory labor progress.     Membranes: ruptured, IUPC,FSE placed, fluid is pink tinged.        Active Problems:   Encounter for induction of labor obesity  PLAN: Continue present management and pitocin if needed. Plan discussed with patient and family. Verbalized and agree to plan.  Shanika [id number]/Salbador Fiveash,[name] 10/11/2017 12:01 PM

## 2017-10-11 NOTE — Progress Notes (Signed)
LABOR NOTE   Cheryl Mills 22 y.o.GP@ at [redacted]w[redacted]d Active phase labor.  SUBJECTIVE:  Comfortable with epidural with occasional urge to push. OBJECTIVE:  BP 102/60 (BP Location: Left Arm)   Pulse 93   Temp 98.1 F (36.7 C) (Oral)   Resp 14   Ht  (1.676 m)   Wt 289 lb (131.1 kg)   SpO2 96%   Breastfeeding? Unknown   BMI 46.65 kg/m  Total I/O In: 2210.3 [I.V.:2210.3] Out: 700 [Urine:700]  She has shown cervical change. CERVIX: 10cm:  100%:   +1:   mid position:    SVE:   Dilation: 10 Effacement (%): 100 Station: Plus 1 Exam by:: [name], [name] CONTRACTIONS: regular, every 1-3 minutes FHR: Fetal heart tracing reviewed. Baseline: 135 bpm, Variability: Good {> 6 bpm), Accelerations: Non-reactive but appropriate for gestational age and Decelerations: Absent Category I   Analgesia: Epidural  Labs: Lab Results  Component Value Date   WBC 11.3 (H) 10/11/2017   HGB 11.9 (L) 10/11/2017   HCT 34.8 (L) 10/11/2017   MCV 84.9 10/11/2017   PLT 285 10/11/2017    ASSESSMENT: 1) Labor curve reviewed.       Progress: Active phase labor.     Membranes: ruptured, clear fluid        Active Problems:   Encounter for induction of labor   PLAN: continue present management, begin pushing.   Doreene Burke, [name]  10/11/2017 6:50 PM

## 2017-10-11 NOTE — Anesthesia Procedure Notes (Signed)
Epidural  Start time: 10/11/2017 1:45 PM  Staffing Anesthesiologist: Alver Fisher, MD Resident/CRNA: Stormy Fabian, CRNA Performed: resident/CRNA   Preanesthetic Checklist Completed: patient identified, site marked, surgical consent, pre-op evaluation, IV checked, risks and benefits discussed and monitors and equipment checked  Epidural Patient position: sitting Prep: ChloraPrep Patient monitoring: cardiac monitor, continuous pulse ox and blood pressure Approach: midline Location: L3-L4 Injection technique: LOR saline  Needle:  Needle type: Tuohy  Needle gauge: 17 G Needle length: 9 cm Needle insertion depth: 8.5 cm Catheter type: closed end flexible Catheter size: 19 Gauge Catheter at skin depth: 14 cm Test dose: negative and 1.5% lidocaine with Epi 1:200 K  Assessment Events: blood not aspirated, injection not painful, no injection resistance, negative IV test and no paresthesia

## 2017-10-12 LAB — CBC
HCT: 32.4 % — ABNORMAL LOW (ref 35.0–47.0)
Hemoglobin: 10.9 g/dL — ABNORMAL LOW (ref 12.0–16.0)
MCH: 28.5 pg (ref 26.0–34.0)
MCHC: 33.5 g/dL (ref 32.0–36.0)
MCV: 85 fL (ref 80.0–100.0)
PLATELETS: 240 10*3/uL (ref 150–440)
RBC: 3.82 MIL/uL (ref 3.80–5.20)
RDW: 15.7 % — AB (ref 11.5–14.5)
WBC: 12.6 10*3/uL — AB (ref 3.6–11.0)

## 2017-10-12 LAB — BPAM RBC
BLOOD PRODUCT EXPIRATION DATE: 201906162359
Blood Product Expiration Date: 201906162359
Unit Type and Rh: 9500
Unit Type and Rh: 9500

## 2017-10-12 LAB — TYPE AND SCREEN
ABO/RH(D): A NEG
ANTIBODY SCREEN: POSITIVE
Unit division: 0
Unit division: 0

## 2017-10-12 LAB — RPR: RPR: NONREACTIVE

## 2017-10-12 MED ORDER — METHYLERGONOVINE MALEATE 0.2 MG/ML IJ SOLN: 0.2000 mg | INTRAMUSCULAR | Status: DC | PRN

## 2017-10-12 MED ORDER — ONDANSETRON HCL 4 MG/2ML IJ SOLN: 4.0000 mg | INTRAMUSCULAR | Status: DC | PRN

## 2017-10-12 MED ORDER — VALACYCLOVIR HCL 1 G PO TABS: 1000.0000 mg | ORAL_TABLET | ORAL | 3 refills | Status: DC | PRN

## 2017-10-12 MED ORDER — WITCH HAZEL-GLYCERIN EX PADS: 1.0000 "application " | MEDICATED_PAD | CUTANEOUS | Status: DC | PRN

## 2017-10-12 MED ORDER — ACETAMINOPHEN 325 MG PO TABS: 650.0000 mg | ORAL_TABLET | ORAL | Status: DC | PRN

## 2017-10-12 MED ORDER — BENZOCAINE-MENTHOL 20-0.5 % EX AERO
1.0000 "application " | INHALATION_SPRAY | CUTANEOUS | Status: DC | PRN
  Filled 2017-10-12: qty 56

## 2017-10-12 MED ORDER — IBUPROFEN 600 MG PO TABS: 600.0000 mg | ORAL_TABLET | Freq: Four times a day (QID) | ORAL | 0 refills | Status: DC

## 2017-10-12 MED ORDER — SIMETHICONE 80 MG PO CHEW: 80.0000 mg | CHEWABLE_TABLET | ORAL | Status: DC | PRN

## 2017-10-12 MED ORDER — SENNOSIDES-DOCUSATE SODIUM 8.6-50 MG PO TABS
2.0000 | ORAL_TABLET | ORAL | Status: DC
Start: 2017-10-12 — End: 2017-10-13

## 2017-10-12 MED ORDER — COCONUT OIL OIL
1.0000 "application " | TOPICAL_OIL | Status: DC | PRN
  Administered 2017-10-12: 1 via TOPICAL
  Filled 2017-10-12: qty 120

## 2017-10-12 MED ORDER — NORETHINDRONE 0.35 MG PO TABS: 1.0000 | ORAL_TABLET | Freq: Every day | ORAL | 11 refills | Status: DC

## 2017-10-12 MED ORDER — METHYLERGONOVINE MALEATE 0.2 MG PO TABS: 0.2000 mg | ORAL_TABLET | ORAL | Status: DC | PRN

## 2017-10-12 MED ORDER — IBUPROFEN 600 MG PO TABS
600.0000 mg | ORAL_TABLET | Freq: Four times a day (QID) | ORAL | Status: DC
  Administered 2017-10-12 – 2017-10-13 (×5): 600 mg via ORAL
  Filled 2017-10-12 (×5): qty 1

## 2017-10-12 MED ORDER — OXYCODONE-ACETAMINOPHEN 5-325 MG PO TABS: 1.0000 | ORAL_TABLET | ORAL | Status: DC | PRN

## 2017-10-12 MED ORDER — OXYCODONE-ACETAMINOPHEN 5-325 MG PO TABS: 2.0000 | ORAL_TABLET | ORAL | Status: DC | PRN

## 2017-10-12 MED ORDER — DOCUSATE SODIUM 100 MG PO CAPS
100.0000 mg | ORAL_CAPSULE | Freq: Two times a day (BID) | ORAL | Status: DC
  Administered 2017-10-12 (×2): 100 mg via ORAL
  Filled 2017-10-12 (×2): qty 1

## 2017-10-12 MED ORDER — PRENATAL MULTIVITAMIN CH
1.0000 | ORAL_TABLET | Freq: Every day | ORAL | Status: DC
  Administered 2017-10-12: 1 via ORAL
  Filled 2017-10-12: qty 1

## 2017-10-12 MED ORDER — ACETAMINOPHEN 325 MG PO TABS: 650.0000 mg | ORAL_TABLET | ORAL | 0 refills | Status: DC | PRN

## 2017-10-12 MED ORDER — FERROUS SULFATE 325 (65 FE) MG PO TABS
325.0000 mg | ORAL_TABLET | Freq: Every day | ORAL | Status: DC
  Administered 2017-10-12 – 2017-10-13 (×2): 325 mg via ORAL
  Filled 2017-10-12 (×2): qty 1

## 2017-10-12 MED ORDER — DIPHENHYDRAMINE HCL 25 MG PO CAPS: 25.0000 mg | ORAL_CAPSULE | Freq: Four times a day (QID) | ORAL | Status: DC | PRN

## 2017-10-12 MED ORDER — ONDANSETRON HCL 4 MG PO TABS: 4.0000 mg | ORAL_TABLET | ORAL | Status: DC | PRN

## 2017-10-12 MED ORDER — DIBUCAINE 1 % RE OINT: 1.0000 "application " | TOPICAL_OINTMENT | RECTAL | Status: DC | PRN

## 2017-10-12 NOTE — Anesthesia Postprocedure Evaluation (Signed)
Anesthesia Post Note  Patient: Cheryl Mills  Procedure(s) Performed: AN AD HOC LABOR EPIDURAL  Patient location during evaluation: Mother Baby Anesthesia Type: Epidural Level of consciousness: awake, awake and alert and oriented Pain management: pain level controlled Vital Signs Assessment: post-procedure vital signs reviewed and stable Respiratory status: spontaneous breathing, nonlabored ventilation and respiratory function stable Cardiovascular status: blood pressure returned to baseline and stable Postop Assessment: no headache Anesthetic complications: no Comments: C/o of back pain at epidural site     Last Vitals:  Vitals:   10/12/17 0220 10/12/17 0243  BP: 125/73 112/66  Pulse: 87 93  Resp: 16 18  Temp: 36.9 C 36.5 C  SpO2:  100%    Last Pain:  Vitals:   10/12/17 0550  TempSrc:   PainSc: 4                  [employer]

## 2017-10-12 NOTE — Lactation Note (Signed)
This note was copied from a baby's chart. Lactation Consultation Note  Patient Name: Girl Genea Rheaume [id number] Date: 10/12/2017 Reason for consult: Initial assessment   Maternal Data Has patient been taught Hand Expression?: Yes Does the patient have breastfeeding experience prior to this delivery?: No  Feeding Feeding Type: Breast Fed Length of feed: 12 min  LATCH Score Latch: Repeated attempts needed to sustain latch, nipple held in mouth throughout feeding, stimulation needed to elicit sucking reflex.  Audible Swallowing: A few with stimulation  Type of Nipple: Everted at rest and after stimulation  Comfort (Breast/Nipple): Soft / non-tender  Hold (Positioning): Assistance needed to correctly position infant at breast and maintain latch.  LATCH Score: 7  Interventions    Lactation Tools Discussed/Used     Consult Status Consult Status: Follow-up Date: 10/12/17 Follow-up type: In-patient Mom needs assistance with positioning baby and having "Marga Hoots" latch on. Mom prefers to feed baby in the football position and needs to sandwich breasts until baby has established a rhythm. Mom denies soreness of nipples and there aren't any marks indicating incorrect latch.    Burnadette Peter 10/12/2017, 10:00 AM

## 2017-10-12 NOTE — Discharge Summary (Signed)
                             Discharge Summary  Date of Admission: 10/11/2017  Date of Discharge: 10/12/2017  Admitting Diagnosis: Induction of labor and for obesity at [redacted]w[redacted]d  Mode of Delivery: normal spontaneous vaginal delivery                 Discharge Diagnosis: No other diagnosis   Intrapartum Procedures: Atificial rupture of membranes, epidural, laceration bilateral sidewall tear,left side repaired, placement of fetal scalp electrode and IUPC   Post partum procedures: postpartum hemorrhage 600 ml  Complications: bilateral vaginal sidewall tear, left side repair and PPH                     Discharge Day SOAP Note:  Progress Note - Vaginal Delivery  Cheryl Mills is a 22 y.o. G1P1001 now PP day 1 s/p Vaginal, Spontaneous . Delivery was uncomplicated  Subjective  The patient has the following complaints: has no unusual complaints  Pain is controlled with current medications. Patient is urinating without difficulty.  She is ambulating well.    Objective  Vital signs: BP 113/75 (BP Location: Left Arm)   Pulse 87   Temp 98.2 F (36.8 C) (Oral)   Resp 18   Ht  (1.676 m)   Wt 289 lb (131.1 kg)   SpO2 99%   Breastfeeding? Unknown   BMI 46.65 kg/m   Physical Exam: Gen: NAD Fundus Fundal Tone: Firm  Lochia Amount: Moderate  Perineum Appearance: Intact     Data Review Labs: CBC Latest Ref Rng & Units 10/12/2017 10/11/2017 07/25/2017  WBC 3.6 - 11.0 K/uL 12.6(H) 11.3(H) 10.2  Hemoglobin 12.0 - 16.0 g/dL 10.9(L) 11.9(L) 11.6  Hematocrit 35.0 - 47.0 % 32.4(L) 34.8(L) 34.9  Platelets 150 - 440 K/uL 240 285 272   A NEG Performed at [facility], [address] [place], [place] [address]   Assessment/Plan  Active Problems:   Encounter for induction of labor    Plan for discharge today.   Discharge Instructions: Per After Visit Summary. Activity: Advance as tolerated. Pelvic rest for 6 weeks.  Also refer to After Visit Summary Diet:  Regular Medications: Allergies as of 10/12/2017   No Known Allergies     Medication List    TAKE these medications   acetaminophen 325 MG tablet Commonly known as:  TYLENOL Take 2 tablets (650 mg total) by mouth every 4 (four) hours as needed (for pain scale < 4).   ibuprofen 600 MG tablet Commonly known as:  ADVIL,MOTRIN Take 1 tablet (600 mg total) by mouth every 6 (six) hours.   multivitamin-prenatal 27-0.8 MG Tabs tablet Take 1 tablet by mouth daily at 12 noon.   valACYclovir 1000 MG tablet Commonly known as:  VALTREX Take 1 tablet (1,000 mg total) by mouth as needed. What changed:    when to take this  reasons to take this      Outpatient follow up:  Postpartum contraception: Progestin Only Pill start at 4 weeks.  Discharged Condition: good  Discharged to: home  Newborn Data: Disposition:home with mother  Apgars: APGAR (1 MIN): 8   APGAR (5 MINS): 9   APGAR (10 MINS):    Baby Feeding: Breast     Alain Deschene,[name] 10/12/2017 1:19 PM

## 2017-10-12 NOTE — Final Progress Note (Signed)
Discharge Day SOAP Note:  Progress Note - Vaginal Delivery  Suzan Manon Riedesel is a 22 y.o. G1P1001 now PP day 1 s/p Vaginal, Spontaneous . Delivery was uncomplicated  Subjective  The patient has the following complaints: has no unusual complaints  Pain is controlled with current medications. Patient is urinating without difficulty.  She is ambulating well.    Objective  Vital signs: BP 113/75 (BP Location: Left Arm)   Pulse 87   Temp 98.2 F (36.8 C) (Oral)   Resp 18   Ht  (1.676 m)   Wt 289 lb (131.1 kg)   SpO2 99%   Breastfeeding? Unknown   BMI 46.65 kg/m   Physical Exam: Gen: NAD Fundus Fundal Tone: Firm  Lochia Amount: Moderate  Perineum Appearance: Intact     Data Review Labs: CBC Latest Ref Rng & Units 10/12/2017 10/11/2017 07/25/2017  WBC 3.6 - 11.0 K/uL 12.6(H) 11.3(H) 10.2  Hemoglobin 12.0 - 16.0 g/dL 10.9(L) 11.9(L) 11.6  Hematocrit 35.0 - 47.0 % 32.4(L) 34.8(L) 34.9  Platelets 150 - 440 K/uL 240 285 272   A NEG Performed at [facility], [address] [place], [place] [address]   Assessment/Plan  Active Problems:   Encounter for induction of labor    Plan for discharge today.   Discharge Instructions: Per After Visit Summary. Activity: Advance as tolerated. Pelvic rest for 6 weeks.  Also refer to After Visit Summary Diet: Regular Medications: Allergies as of 10/12/2017   No Known Allergies     Medication List    TAKE these medications   acetaminophen 325 MG tablet Commonly known as:  TYLENOL Take 2 tablets (650 mg total) by mouth every 4 (four) hours as needed (for pain scale < 4).   ibuprofen 600 MG tablet Commonly known as:  ADVIL,MOTRIN Take 1 tablet (600 mg total) by mouth every 6 (six) hours.   multivitamin-prenatal 27-0.8 MG Tabs tablet Take 1 tablet by mouth daily at 12 noon.   valACYclovir 1000 MG tablet Commonly known as:  VALTREX Take 1 tablet (1,000 mg total) by mouth as needed. What changed:    when  to take this  reasons to take this      Outpatient follow up:  Postpartum contraception: Progestin Only Pill start at 4 weeks.  Discharged Condition: good  Discharged to: home  Newborn Data: Disposition:home with mother  Apgars: APGAR (1 MIN): 8   APGAR (5 MINS): 9   APGAR (10 MINS):    Baby Feeding: Breast     Olimpia Tinch,[name] 10/12/2017 1:19 PM

## 2017-10-13 NOTE — Final Progress Note (Addendum)
Discharge Day SOAP Note:  Progress Note - Vaginal Delivery  Cheryl Mills is a 22 y.o. G1P1001 now PP day 2 s/p Vaginal, Spontaneous . Delivery was uncomplicated. Discharge was delayed yesterday due to fetal indications. Patient decided she wanted additional help with breastfeeding and stayed through the night.   Subjective  The patient has the following complaints: has no unusual complaints  Pain is controlled with current medications. Patient is urinating without difficulty.  She is ambulating well.    Objective  Vital signs: BP 109/72 (BP Location: Left Arm)   Pulse 69   Temp 97.6 F (36.4C) (Oral)   Resp 20  Ht  (1.676 m)   Wt 289 lb (131.1 kg)   SpO2 99%   Breastfeeding? Unknown   BMI 46.65 kg/m   Physical Exam: Gen: NAD Fundus Fundal Tone: Firm  Lochia Amount: Moderate  Perineum Appearance: Intact                Data Review Labs: CBC Latest Ref Rng & Units 10/12/2017 10/11/2017 07/25/2017  WBC 3.6 - 11.0 K/uL 12.6(H) 11.3(H) 10.2  Hemoglobin 12.0 - 16.0 g/dL 10.9(L) 11.9(L) 11.6  Hematocrit 35.0 - 47.0 % 32.4(L) 34.8(L) 34.9  Platelets 150 - 440 K/uL 240 285 272   A NEG Performed at [facility], [address] [place], [place] [address]   Assessment/Plan  Active Problems:   Encounter for induction of labor    Plan for discharge today.   Discharge Instructions: Per After Visit Summary. Activity: Advance as tolerated. Pelvic rest for 6 weeks.  Also refer to After Visit Summary Diet: Regular Medications: Allergies as of 10/12/2017   No Known Allergies        Medication List    TAKE these medications   acetaminophen 325 MG tablet Commonly known as:  TYLENOL Take 2 tablets (650 mg total) by mouth every 4 (four) hours as needed (for pain scale < 4).   ibuprofen 600 MG tablet Commonly known as:  ADVIL,MOTRIN Take 1 tablet (600 mg total) by mouth every 6 (six) hours.   multivitamin-prenatal 27-0.8 MG Tabs  tablet Take 1 tablet by mouth daily at 12 noon.   valACYclovir 1000 MG tablet Commonly known as:  VALTREX Take 1 tablet (1,000 mg total) by mouth as needed. What changed:    when to take this  reasons to take this      Outpatient follow up:  Postpartum contraception: Progestin Only Pill start at 4 weeks.  Discharged Condition: good  Discharged to: home  Newborn Data: Baby blood type O negative, rhogam not indicated. Disposition:home with mother  Apgars: APGAR (1 MIN): 8   APGAR (5 MINS): 9   APGAR (10 MINS):    Baby Feeding: Breast     Eamonn Sermeno,[name]

## 2017-10-13 NOTE — Progress Notes (Signed)
Discharge instr reviewed with mom.  Verb u/o 

## 2017-10-13 NOTE — Discharge Summary (Signed)
Discharge Summary  Date of Admission: 10/11/2017  Date of Discharge: 10/13/2017  Admitting Diagnosis: Induction of labor and for obesity at 106w2d  Mode of Delivery: normal spontaneous vaginal delivery                                                  Discharge Diagnosis: No other diagnosis              Intrapartum Procedures: Atificial rupture of membranes, epidural, laceration bilateral sidewall tear,left side repaired, placement of fetal scalp electrode and IUPC              Post partum procedures: postpartum hemorrhage 600 ml  Complications: bilateral vaginal sidewall tear, left side repair was repaired and PPH    Discharge Day SOAP Note:  Progress Note - Vaginal Delivery  Cheryl Mills is a 22 y.o. G1P1001 now PP day 2 s/p Vaginal, Spontaneous . Delivery was uncomplicated. Discharge was delayed yesterday due to fetal indications. Patient decided she wanted additional help with breastfeeding and stayed through the night.   Subjective  The patient has the following complaints: has no unusual complaints  Pain is controlled with current medications. Patient is urinating without difficulty.  She is ambulating well.    Objective  Vital signs: BP 109/72 (BP Location: Left Arm)   Pulse 69   Temp 97.6 F (36.4C) (Oral)   Resp 20  Ht  (1.676 m)   Wt 289 lb (131.1 kg)   SpO2 99%   Breastfeeding? Unknown   BMI 46.65 kg/m   Physical Exam: Gen: NAD Fundus Fundal Tone: Firm  Lochia Amount: Moderate  Perineum Appearance: Intact                Data Review Labs: CBC Latest Ref Rng & Units 10/12/2017 10/11/2017 07/25/2017  WBC 3.6 - 11.0 K/uL 12.6(H) 11.3(H) 10.2  Hemoglobin 12.0 - 16.0 g/dL 10.9(L) 11.9(L) 11.6  Hematocrit 35.0 - 47.0 % 32.4(L) 34.8(L) 34.9  Platelets 150 - 440 K/uL 240 285 272   A NEG Performed at [facility], [address] [place], [place] [address]   Assessment/Plan  Active Problems:   Encounter for induction of  labor    Plan for discharge today.   Discharge Instructions: Per After Visit Summary. Activity: Advance as tolerated. Pelvic rest for 6 weeks.  Also refer to After Visit Summary Diet: Regular Medications: Allergies as of 10/12/2017   No Known Allergies        Medication List    TAKE these medications   acetaminophen 325 MG tablet Commonly known as:  TYLENOL Take 2 tablets (650 mg total) by mouth every 4 (four) hours as needed (for pain scale < 4).   ibuprofen 600 MG tablet Commonly known as:  ADVIL,MOTRIN Take 1 tablet (600 mg total) by mouth every 6 (six) hours.   multivitamin-prenatal 27-0.8 MG Tabs tablet Take 1 tablet by mouth daily at 12 noon.   valACYclovir 1000 MG tablet Commonly known as:  VALTREX Take 1 tablet (1,000 mg total) by mouth as needed. What changed:    when to take this  reasons to take this      Outpatient follow up:  Postpartum contraception: Progestin Only Pill start at 4 weeks.  Discharged Condition: good  Discharged to: home  Newborn Data: Disposition:home with mother  Apgars: APGAR (1 MIN):  8   APGAR (5 MINS): 9   APGAR (10 MINS):    Baby Feeding: Breast     Jaylin Roundy,[name]

## 2017-10-13 NOTE — Plan of Care (Signed)
Vs stable; up ad lib; tolerating regular diet; taking motrin for pain control; breastfeeding and does some feeds independently and sometimes a little assistance needed; possible discharge today

## 2017-10-13 NOTE — Progress Notes (Signed)
Dc to home with NB to car via auxillary

## 2017-10-29 ENCOUNTER — Encounter: Payer: Self-pay | Admitting: Certified Nurse Midwife

## 2017-11-16 ENCOUNTER — Encounter: Payer: Self-pay | Admitting: Certified Nurse Midwife

## 2017-11-16 ENCOUNTER — Ambulatory Visit (INDEPENDENT_AMBULATORY_CARE_PROVIDER_SITE_OTHER): Payer: Medicaid Other | Admitting: Certified Nurse Midwife

## 2017-11-16 NOTE — Progress Notes (Signed)
Pt is here for a post partum visit. Is trying to breast feed but is mostly bottle feeding. Has not had a period. Has resumed intercourse with no protection. Would like to start OCPs. Screening 3

## 2017-11-16 NOTE — Progress Notes (Signed)
Subjective:    Cheryl Mills is a 22 y.o. 621P1001 Caucasian female who presents for a postpartum visit. She is 6 weeks postpartum following a spontaneous vaginal delivery at 39.2 gestational weeks. Anesthesia: epidural. I have fully reviewed the prenatal and intrapartum course. Postpartum course has been WNL. Baby's course has been WNL. Baby is feeding by breast and bottle. Bleeding no bleeding. Bowel function is normal. Bladder function is normal. Patient is sexually active.  Contraception method is none. Plans on starting the progestin only pill as orderd on d/c from hospital.Postpartum depression screening: negative. Score 3.  Last pap unsure and was negative.  The following portions of the patient's history were reviewed and updated as appropriate: allergies, current medications, past medical history, past surgical history and problem list.  Review of Systems Pertinent items are noted in HPI.   Vitals:   11/16/17 1332  BP: 99/78  Pulse: 85  Weight: 260 lb 5 oz (118.1 kg)  Height: 5\' 6"  (1.676 m)   No LMP recorded.  Objective:   General:  alert, cooperative and no distress   Breasts:  deferred, no complaints  Lungs: clear to auscultation bilaterally  Heart:  regular rate and rhythm  Abdomen: soft, nontender   Vulva: normal  Vagina: normal vagina  Cervix:  closed  Corpus: Well-involuted  Adnexa:  Non-palpable  Rectal Exam: no hemorrhoids        Assessment:   Postpartum exam 6 wks s/p AVD Breast &bottle feeding Depression screening Contraception counseling   Plan:  : oral progesterone-only contraceptive Follow up in: 6 months for annual exam and pap smear or earlier if needed  Doreene BurkeAnnie Alizaya Oshea, [name]

## 2017-11-16 NOTE — Patient Instructions (Signed)
Preventing Cervical Cancer Cervical cancer is cancer that grows on the cervix. The cervix is at the bottom of the uterus. It connects the uterus to the vagina. The uterus is where a baby develops during pregnancy. Cancer occurs when cells become abnormal and start to grow out of control. Cervical cancer grows slowly and may not cause any symptoms at first. Over time, the cancer can grow deep into the cervix tissue and spread to other areas. If it is found early, cervical cancer can be treated effectively. You can also take steps to prevent this type of cancer. Most cases of cervical cancer are caused by an STI (sexually transmitted infection) called human papillomavirus (HPV). One way to reduce your risk of cervical cancer is to avoid infection with the HPV virus. You can do this by practicing safe sex and by getting the HPV vaccine. Getting regular Pap tests is also important because this can help identify changes in cells that could lead to cancer. Your chances of getting this disease can also be reduced by making certain lifestyle changes. How can I protect myself from cervical cancer? Preventing HPV infection  Ask your health care provider about getting the HPV vaccine. If you are 26 years old or younger, you may need to get this vaccine, which is given in three doses over 6 months. This vaccine protects against the types of HPV that could cause cancer.  Limit the number of people you have sex with. Also avoid having sex with people who have had many sex partners.  Use a latex condom during sex. Getting Pap tests  Get Pap tests regularly, starting at age 21. Talk with your health care provider about how often you need these tests. ? Most women who are 21?24 years of age should have a Pap test every 3 years. ? Most women who are 30?24 years of age should have a Pap test in combination with an HPV test every 5 years. ? Women with a higher risk of cervical cancer, such as those with a weakened  immune system or those who have been exposed to the drug diethylstilbestrol (DES), may need more frequent testing. Making other lifestyle changes  Do not use any products that contain nicotine or tobacco, such as cigarettes and e-cigarettes. If you need help quitting, ask your health care provider.  Eat at least 5 servings of fruits and vegetables every day.  Lose weight if you are overweight. Why are these changes important?  These changes and screening tests are designed to address the factors that are known to increase the risk of cervical cancer. Taking these steps is the best way to reduce your risk.  Having regular Pap tests will help identify changes in cells that could lead to cancer. Steps can then be taken to prevent cancer from developing.  These changes will also help find cervical cancer early. This type of cancer can be treated effectively if it is found early. It can be more dangerous and difficult to treat if cancer has grown deep into your cervix or has spread.  In addition to making you less likely to get cervical cancer, these changes will also provide other health benefits, such as the following: ? Practicing safe sex is important for preventing STIs and unplanned pregnancies. ? Avoiding tobacco can reduce your risk for other cancers and health issues. ? Eating a healthy diet and maintaining a healthy weight are good for your overall health. What can happen if changes are not made? In the   early stages, cervical cancer might not have any symptoms. It can take many years for the cancer to grow and get deep into the cervix tissue. This may be happening without you knowing about it. If you develop any symptoms, such as pelvic pain or unusual discharge or bleeding from your vagina, you should see your health care provider right away. If cervical cancer is not found early, you might need treatments such as radiation, chemotherapy, or surgery. In some cases, surgery may mean that  you will not be able to get pregnant or carry a pregnancy to term. Where to find support: Talk with your health care provider, school nurse, or local health department for guidance about screening and vaccination. Some children and teens may be able to get the HPV vaccine free of charge through the U.S. government's Vaccines for Children (VFC) program. Other places that provide vaccinations include:  Public health clinics. Check with your local health department.  Federally Qualified Health Centers, where you would pay only what you can afford. To find one near you, check this website: www.fqhc.org/find-an-fqhc/  Rural Health Clinics. These are part of a program for Medicare and Medicaid patients who live in rural areas.  The National Breast and Cervical Cancer Early Detection Program also provides breast and cervical cancer screenings and diagnostic services to low-income, uninsured, and underinsured women. Cervical cancer can be passed down through families. Talk with your health care provider or genetic counselor to learn more about genetic testing for cancer. Where to find more information: Learn more about cervical cancer from:  American College of Gynecology: www.acog.org/Patients/FAQs/Cervical-Cancer  American Cancer Society: www.cancer.org/cancer/cervicalcancer/  U.S. Centers for Disease Control and Prevention: www.cdc.gov/cancer/cervical/  Summary  Talk with your health care provider about getting the HPV vaccine.  Be sure to get regular Pap tests as recommended by your health care provider.  See your health care provider right away if you have any pelvic pain or unusual discharge or bleeding from your vagina. This information is not intended to replace advice given to you by your health care provider. Make sure you discuss any questions you have with your health care provider. Document Released: 06/01/2015 Document Revised: 01/13/2016 Document Reviewed: 01/13/2016 Elsevier  Interactive Patient Education  2018 [employer]vier Inc.  

## 2018-03-09 ENCOUNTER — Ambulatory Visit
Admission: EM | Admit: 2018-03-09 | Discharge: 2018-03-09 | Disposition: A | Discharge status: Home / Self Care | Payer: BLUE CROSS/BLUE SHIELD | Attending: Family Medicine | Admitting: Family Medicine

## 2018-03-09 ENCOUNTER — Encounter: Payer: Self-pay | Admitting: Emergency Medicine

## 2018-03-09 ENCOUNTER — Other Ambulatory Visit: Payer: Self-pay

## 2018-03-09 DIAGNOSIS — J029 Acute pharyngitis, unspecified: Secondary | ICD-10-CM | POA: Diagnosis not present

## 2018-03-09 DIAGNOSIS — Z87891 Personal history of nicotine dependence: Secondary | ICD-10-CM

## 2018-03-09 LAB — RAPID STREP SCREEN (MED CTR MEBANE ONLY): STREPTOCOCCUS, GROUP A SCREEN (DIRECT): NEGATIVE

## 2018-03-09 MED ORDER — AMOXICILLIN-POT CLAVULANATE 875-125 MG PO TABS: 1.0000 | ORAL_TABLET | Freq: Two times a day (BID) | ORAL | 0 refills | Status: DC

## 2018-03-09 NOTE — ED Triage Notes (Signed)
Patient c/o sore throat, congestion and HAs, and cough since Monday.

## 2018-03-09 NOTE — ED Provider Notes (Signed)
[facility]    CSN: [id number] Arrival date & time: 03/09/18  1218  History   Chief Complaint Chief Complaint  Patient presents with  . Sore Throat   HPI  22 year old female presents with sore throat, congestion.  Started Monday.  Has a history of recurrent strep throat.  Reports severe sore throat, congestion.  Mild cough.  No fever.  She does endorse chills.  No reported sick contacts.  She is not breast-feeding.  No known exacerbating or relieving factors.  No other associated symptoms.  No other complaints.  PMH, Surgical Hx, Family Hx, Social History reviewed and updated as below.  Past Medical History:  Diagnosis Date  . Herpes, genital    age 33y first and only breakfast  . Missed menses   . PCOS (polycystic ovarian syndrome)    Patient Active Problem List   Diagnosis Date Noted  . Encounter for induction of labor 10/11/2017  . BMI 40.0-44.9, adult (HCC) 03/30/2017  . Rh negative state in antepartum period 03/09/2017  . History of chlamydia 03/09/2017   Past Surgical History:  Procedure Laterality Date  . TYMPANOSTOMY TUBE PLACEMENT     OB History    Gravida  1   Para  1   Term  1   Preterm      AB      Living  1     SAB      TAB      Ectopic      Multiple  0   Live Births  1          Home Medications    Prior to Admission medications   Medication Sig Start Date End Date Taking? Authorizing Provider  amoxicillin-clavulanate (AUGMENTIN) 875-125 MG tablet Take 1 tablet by mouth every 12 (twelve) hours. 03/09/18   Tommie Sams, DO    Family History Family History  Problem Relation Age of Onset  . Migraines Mother   . Hyperlipidemia Father   . Hypertension Father   . Asthma Sister   . Cancer Sister        cervical  . Migraines Sister   . Thyroid disease Sister   . Cancer Paternal Grandmother        breast, had mastectomy  . Diabetes Paternal Grandmother   . Hyperlipidemia Paternal Grandmother   . Hypertension  Paternal Grandmother   . Thyroid disease Paternal Grandmother     Social History Social History   Tobacco Use  . Smoking status: Former Games developer  . Smokeless tobacco: Never Used  Substance Use Topics  . Alcohol use: No  . Drug use: No     Allergies   Patient has no known allergies.   Review of Systems Review of Systems  Constitutional: Positive for chills. Negative for fever.  HENT: Positive for congestion and sore throat.    Physical Exam Triage Vital Signs ED Triage Vitals  Enc Vitals Group     BP 03/09/18 1234 97/66     Pulse Rate 03/09/18 1234 99     Resp 03/09/18 1234 14     Temp 03/09/18 1234 98.1 F (36.7 C)     Temp Source 03/09/18 1234 Oral     SpO2 03/09/18 1234 99 %     Weight 03/09/18 1230 250 lb (113.4 kg)     Height 03/09/18 1230 5\' 5"  (1.651 m)     Head Circumference --      Peak Flow --      Pain Score  03/09/18 1230 7     Pain Loc --      Pain Edu? --      Excl. in GC? --    Updated Vital Signs BP 97/66 (BP Location: Left Arm)   Pulse 99   Temp 98.1 F (36.7 C) (Oral)   Resp 14   Ht 5\' 5"  (1.651 m)   Wt 113.4 kg   LMP 03/02/2018 (Approximate)   SpO2 99%   Breastfeeding? No   BMI 41.60 kg/m   Visual Acuity Right Eye Distance:   Left Eye Distance:   Bilateral Distance:    Right Eye Near:   Left Eye Near:    Bilateral Near:     Physical Exam  Constitutional: She is oriented to person, place, and time. She appears well-developed. No distress.  HENT:  Nose: Nose normal.  Severe oropharyngeal erythema.  Tonsillar exudate noted.  Tonsillar edema noted.  No evidence of peritonsillar abscess.  Eyes: Conjunctivae are normal. Right eye exhibits no discharge. Left eye exhibits no discharge.  Cardiovascular: Normal rate and regular rhythm.  Pulmonary/Chest: Effort normal and breath sounds normal. She has no wheezes. She has no rales.  Neurological: She is alert and oriented to person, place, and time.  Psychiatric: She has a normal mood  and affect. Her behavior is normal.  Nursing note and vitals reviewed.  UC Treatments / Results  Labs (all labs ordered are listed, but only abnormal results are displayed) Labs Reviewed  RAPID STREP SCREEN ([facility] ONLY)  CULTURE, GROUP A STREP [facility])    EKG None  Radiology No results found.  Procedures Procedures (including critical care time)  Medications Ordered in UC Medications - No data to display  Initial Impression / Assessment and Plan / UC Course  I have reviewed the triage vital signs and the nursing notes.  Pertinent labs & imaging results that were available during my care of the patient were reviewed by me and considered in my medical decision making (see chart for details).    22 year old female presents with pharyngitis.  Suspect strep pharyngitis given physical exam.  Her strep test was negative.  Awaiting culture.  Placing on Augmentin while awaiting culture.  Final Clinical Impressions(s) / UC Diagnoses   Final diagnoses:  Pharyngitis, unspecified etiology   Discharge Instructions   None    ED Prescriptions    Medication Sig Dispense Auth. Provider   amoxicillin-clavulanate (AUGMENTIN) 875-125 MG tablet Take 1 tablet by mouth every 12 (twelve) hours. 20 tablet Tommie Sams, DO     Controlled Substance Prescriptions [place] Controlled Substance Registry consulted? Not Applicable   Tommie Sams, DO 03/09/18 1610

## 2018-03-12 LAB — CULTURE, GROUP A STREP (THRC)

## 2018-03-13 ENCOUNTER — Telehealth (HOSPITAL_COMMUNITY): Payer: Self-pay

## 2018-03-13 NOTE — Telephone Encounter (Signed)
Culture is positive for non group A Strep germ.  This is a finding of uncertain significance; not the typical 'strep throat' germ.  Pt treated with Amoxicillin.

## 2018-03-28 ENCOUNTER — Emergency Department
Admission: EM | Admit: 2018-03-28 | Discharge: 2018-03-28 | Disposition: A | Discharge status: Home / Self Care | Payer: BLUE CROSS/BLUE SHIELD | Attending: Emergency Medicine | Admitting: Emergency Medicine

## 2018-03-28 ENCOUNTER — Other Ambulatory Visit: Payer: Self-pay

## 2018-03-28 ENCOUNTER — Encounter: Payer: Self-pay | Admitting: Specialist

## 2018-03-28 DIAGNOSIS — R21 Rash and other nonspecific skin eruption: Secondary | ICD-10-CM | POA: Diagnosis present

## 2018-03-28 DIAGNOSIS — Z87891 Personal history of nicotine dependence: Secondary | ICD-10-CM | POA: Diagnosis not present

## 2018-03-28 LAB — CBC
HEMATOCRIT: 38 % (ref 36.0–46.0)
HEMOGLOBIN: 12.3 g/dL (ref 12.0–15.0)
MCH: 26.6 pg (ref 26.0–34.0)
MCHC: 32.4 g/dL (ref 30.0–36.0)
MCV: 82.1 fL (ref 80.0–100.0)
NRBC: 0 % (ref 0.0–0.2)
PLATELETS: 287 10*3/uL (ref 150–400)
RBC: 4.63 MIL/uL (ref 3.87–5.11)
RDW: 14.3 % (ref 11.5–15.5)
WBC: 6.5 10*3/uL (ref 4.0–10.5)

## 2018-03-28 LAB — COMPREHENSIVE METABOLIC PANEL
ALT: 18 U/L (ref 0–44)
ANION GAP: 8 (ref 5–15)
AST: 15 U/L (ref 15–41)
Albumin: 3.9 g/dL (ref 3.5–5.0)
Alkaline Phosphatase: 75 U/L (ref 38–126)
BUN: 12 mg/dL (ref 6–20)
CHLORIDE: 107 mmol/L (ref 98–111)
CO2: 26 mmol/L (ref 22–32)
Calcium: 8.6 mg/dL — ABNORMAL LOW (ref 8.9–10.3)
Creatinine, Ser: 0.65 mg/dL (ref 0.44–1.00)
Glucose, Bld: 108 mg/dL — ABNORMAL HIGH (ref 70–99)
Potassium: 3.8 mmol/L (ref 3.5–5.1)
Sodium: 141 mmol/L (ref 135–145)
Total Bilirubin: 0.5 mg/dL (ref 0.3–1.2)
Total Protein: 7.4 g/dL (ref 6.5–8.1)

## 2018-03-28 LAB — SEDIMENTATION RATE: SED RATE: 13 mm/h (ref 0–20)

## 2018-03-28 LAB — MONONUCLEOSIS SCREEN: MONO SCREEN: NEGATIVE

## 2018-03-28 MED ORDER — FAMOTIDINE IN NACL 20-0.9 MG/50ML-% IV SOLN
20.0000 mg | Freq: Once | INTRAVENOUS | Status: AC
  Administered 2018-03-28: 20 mg via INTRAVENOUS
  Filled 2018-03-28: qty 50

## 2018-03-28 MED ORDER — CLOBETASOL PROPIONATE 0.05 % EX CREA: 1.0000 "application " | TOPICAL_CREAM | Freq: Two times a day (BID) | CUTANEOUS | 0 refills | Status: DC

## 2018-03-28 MED ORDER — SODIUM CHLORIDE 0.9 % IV BOLUS
1000.0000 mL | Freq: Once | INTRAVENOUS | Status: AC
  Administered 2018-03-28: 1000 mL via INTRAVENOUS

## 2018-03-28 MED ORDER — FEXOFENADINE HCL 180 MG PO TABS: 180.0000 mg | ORAL_TABLET | Freq: Every day | ORAL | 0 refills | Status: DC

## 2018-03-28 MED ORDER — METHYLPREDNISOLONE SODIUM SUCC 125 MG IJ SOLR
125.0000 mg | Freq: Once | INTRAMUSCULAR | Status: AC
  Administered 2018-03-28: 125 mg via INTRAVENOUS

## 2018-03-28 MED ORDER — METHYLPREDNISOLONE SODIUM SUCC 125 MG IJ SOLR
125.0000 mg | Freq: Once | INTRAMUSCULAR | Status: DC
  Filled 2018-03-28: qty 2

## 2018-03-28 MED ORDER — DIPHENHYDRAMINE HCL 50 MG/ML IJ SOLN
50.0000 mg | Freq: Once | INTRAMUSCULAR | Status: AC
  Administered 2018-03-28: 50 mg via INTRAVENOUS
  Filled 2018-03-28: qty 1

## 2018-03-28 NOTE — ED Notes (Signed)
Dr Darnelle Catalan in with pt

## 2018-03-28 NOTE — Consult Note (Signed)
[facility] [place] [facility]   PATIENT NAME: Cheryl Mills    MR#:  [id number]  DATE OF BIRTH:  1995-10-28  DATE OF CONSULT:  03/28/2018  PRIMARY CARE PHYSICIAN: Patient, No Pcp Per   REQUESTING/REFERRING PHYSICIAN: Dr. Dorothea Glassman  CHIEF COMPLAINT:   Chief Complaint  Patient presents with  . Conjunctivitis  . Rash    HISTORY OF PRESENT ILLNESS:  Cheryl Mills  is a 22 y.o. female with a known history of polycystic ovarian syndrome, genital herpes who presents to the hospital due to a systemic rash.  Patient says she was recently diagnosed with a suspected strep infection and was started on Augmentin.  She took only a few tablets of the Augmentin and started to develop a rash on her right wrist.  She went to urgent care and told that she is having an allergic rash since her Augmentin was stopped and she was switched to Keflex.  Her rash has progressively gotten worse and it is all over her body now it is pruritic in nature and she had no associated fever chills tongue swelling.  He does have some mild conjunctival injection but she says that she had a pinkeye prior to her getting her rash.  Patient denies any other associated symptoms.  PAST MEDICAL HISTORY:   Past Medical History:  Diagnosis Date  . Herpes, genital    age 42y first and only breakfast  . Missed menses   . PCOS (polycystic ovarian syndrome)     PAST SURGICAL HISTOIRY:   Past Surgical History:  Procedure Laterality Date  . TYMPANOSTOMY TUBE PLACEMENT      SOCIAL HISTORY:   Social History   Tobacco Use  . Smoking status: Former Games developer  . Smokeless tobacco: Never Used  Substance Use Topics  . Alcohol use: No    FAMILY HISTORY:   Family History  Problem Relation Age of Onset  . Migraines Mother   . Hyperlipidemia Father   . Hypertension Father   . COPD Father   . Asthma Sister   . Cancer Sister        cervical  . Migraines Sister   . Thyroid disease Sister   .  Cancer Paternal Grandmother        breast, had mastectomy  . Diabetes Paternal Grandmother   . Hyperlipidemia Paternal Grandmother   . Hypertension Paternal Grandmother   . Thyroid disease Paternal Grandmother     DRUG ALLERGIES:   Allergies  Allergen Reactions  . Amoxil [Amoxicillin] Rash    REVIEW OF SYSTEMS:   Review of Systems  Constitutional: Negative for chills, fever and weight loss.  HENT: Negative for congestion, nosebleeds and tinnitus.   Eyes: Negative for blurred vision, double vision and redness.  Respiratory: Negative for cough, hemoptysis, shortness of breath and wheezing.   Cardiovascular: Negative for chest pain, orthopnea, leg swelling and PND.  Gastrointestinal: Negative for abdominal pain, diarrhea, melena, nausea and vomiting.  Genitourinary: Negative for dysuria, hematuria and urgency.  Musculoskeletal: Negative for falls and joint pain.  Skin: Positive for itching and rash.  Neurological: Negative for dizziness, tingling, sensory change, focal weakness, seizures, weakness and headaches.  Endo/Heme/Allergies: Negative for polydipsia. Does not bruise/bleed easily.  Psychiatric/Behavioral: Negative for depression and memory loss. The patient is not nervous/anxious.   All other systems reviewed and are negative.    MEDICATIONS AT HOME:   Prior to Admission medications   Medication Sig Start Date End Date Taking? Authorizing Provider  amoxicillin-clavulanate (  AUGMENTIN) 875-125 MG tablet Take 1 tablet by mouth every 12 (twelve) hours. 03/09/18   Tommie Sams, DO  clobetasol cream (TEMOVATE) 0.05 % Apply 1 application topically 2 (two) times daily. 03/28/18   Houston Siren, MD  fexofenadine (ALLEGRA) 180 MG tablet Take 1 tablet (180 mg total) by mouth daily. 03/28/18   Houston Siren, MD      VITAL SIGNS:  Blood pressure (!) 110/56, pulse 70, temperature 98.2 F (36.8 C), temperature source Oral, resp. rate 16, height 5\' 5"  (1.651 m), weight 112.5  kg, last menstrual period 03/02/2018, SpO2 100 %, not currently breastfeeding.  PHYSICAL EXAMINATION:  GENERAL:  22 y.o.-year-old patient lying in the bed in no acute distress.  EYES: Pupils equal, round, reactive to light and accommodation. No scleral icterus. Extraocular muscles intact. Right conjunctival injection. HEENT: Head atraumatic, normocephalic. Oropharynx and nasopharynx clear.  NECK:  Supple, no jugular venous distention. No thyroid enlargement, no tenderness.  LUNGS: Normal breath sounds bilaterally, no wheezing, rales, rhonchi . No use of accessory muscles of respiration.  CARDIOVASCULAR: S1, S2, RRR. No murmurs, rubs, gallops, clicks.  ABDOMEN: Soft, nontender, nondistended. Bowel sounds present. No organomegaly or mass.  EXTREMITIES: No pedal edema, cyanosis, or clubbing.  NEUROLOGIC: Cranial nerves II through XII are intact. No focal motor or sensory deficits appreciated bilaterally  PSYCHIATRIC: The patient is alert and oriented x 3. Good affect SKIN: No obvious rash, lesion, or ulcer.  Pruritic macular rash throughout the body with some scaly areas which is itchy.  No evidence of bullous or vesicular lesions are noted.  No oropharyngeal involvement noted.  LABORATORY PANEL:   CBC Recent Labs  Lab 03/28/18 0742  WBC 6.5  HGB 12.3  HCT 38.0  PLT 287   ------------------------------------------------------------------------------------------------------------------  Chemistries  Recent Labs  Lab 03/28/18 0742  NA 141  K 3.8  CL 107  CO2 26  GLUCOSE 108*  BUN 12  CREATININE 0.65  CALCIUM 8.6*  AST 15  ALT 18  ALKPHOS 75  BILITOT 0.5   ------------------------------------------------------------------------------------------------------------------  Cardiac Enzymes No results for input(s): TROPONINI in the last 168 hours. ------------------------------------------------------------------------------------------------------------------  RADIOLOGY:  No  results found.   IMPRESSION AND PLAN:   22 year old female with past medical history of PCOS who presents to the hospital secondary to a systemic rash.  1.  Systemic rash- most likely this is a urticarial/contact dermatitis.  The exact etiology of the rash is unclear.  It is unlikely to be a drug rash.  It could possibly be a post streptococcal infection rash.  I do not think the patient has underlying Stevens-Johnson syndrome. - Discussed the case with [place] dermatology Dr. Gwen Pounds and patient has an appointment with a dermatologist tomorrow at 4:30 PM with Dr. Neale Burly.   - I will discharge her on some topical clobetasol to be applied 2-3 times a day. - Allegra during the day for itching, Benadryl at night.  Advised the patient that if she starts getting cutaneous oropharyngeal involvement to come back to the hospital or if she develops any further shortness of breath.  The case was discussed with the patient's mother at bedside 2.  Patient is going be discharged from the ER with outpatient dermatology follow-up tomorrow and she has been given prescriptions as mentioned above.    All the records are reviewed and case discussed with Consulting provider. Management plans discussed with the patient, family and they are in agreement.  CODE STATUS: Full code  TOTAL TIME TAKING CARE OF  THIS PATIENT: 60 minutes.   Greater than 50% of time spent in coordinating care with dermatology and also the ER staff.  Houston Siren M.D on 03/28/2018 at 11:20 AM  Between 7am to 6pm - Pager - [phone]  After 6pm go to [email or web address] - password [email or web address]  [facility]  Office  [phone]  CC: Primary care Physician: Patient, No Pcp Per

## 2018-03-28 NOTE — ED Triage Notes (Signed)
Patient reports rash for the past 3 weeks that itches.  Patient also reports having eye redness and recently had been diagnosed with pink eye and strep.

## 2018-03-28 NOTE — ED Provider Notes (Signed)
Patient examined by me at request of PA.  The patient has diffuse itchy rash macules and papules which look like blisters that have been unroofed.  This is over the entire body.  Her lips are involved in the rash as well and her right eye is injected.  She has had outpatient treatment with Benadryl and has gotten worse.  The rest of the patient's history is as noted by the PA.  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

## 2018-03-28 NOTE — ED Provider Notes (Signed)
[facility] Emergency Department Provider Note  ____________________________________________  Time seen: Approximately 7:42 AM  I have reviewed the triage vital signs and the nursing notes.   HISTORY  Chief Complaint Conjunctivitis and Rash    HPI Cheryl Mills is a 22 y.o. female that presents emergency department for evaluation of pruritic and painful rash throughout body for 2-3 weeks.  Patient was treated with Augmentin on 10/10 for strep throat.  She discontinued medication 3 days later because she developed a rash to her wrist.  She went back to urgent care on 10/19 and was started on Keflex for atypical strep throat infection.  She discontinued Keflex 3 days ago.  Rash has continued to spread to entire body.  She has lesions to the tip of her nose and her lips. Her lips feel numb.  Rash is primarily painful when she showers.  Yesterday she noticed drainage from her right eye and woke up this morning with a red right eye. Noone else has a rash. No recent fever.   Past Medical History:  Diagnosis Date  . Herpes, genital    age 4y first and only breakfast  . Missed menses   . PCOS (polycystic ovarian syndrome)     Patient Active Problem List   Diagnosis Date Noted  . Encounter for induction of labor 10/11/2017  . BMI 40.0-44.9, adult (HCC) 03/30/2017  . Rh negative state in antepartum period 03/09/2017  . History of chlamydia 03/09/2017    Past Surgical History:  Procedure Laterality Date  . TYMPANOSTOMY TUBE PLACEMENT      Prior to Admission medications   Medication Sig Start Date End Date Taking? Authorizing Provider  amoxicillin-clavulanate (AUGMENTIN) 875-125 MG tablet Take 1 tablet by mouth every 12 (twelve) hours. 03/09/18   Tommie Sams, DO  clobetasol cream (TEMOVATE) 0.05 % Apply 1 application topically 2 (two) times daily. 03/28/18   Houston Siren, MD  fexofenadine (ALLEGRA) 180 MG tablet Take 1 tablet (180 mg total) by mouth  daily. 03/28/18   Houston Siren, MD    Allergies Amoxil [amoxicillin]  Family History  Problem Relation Age of Onset  . Migraines Mother   . Hyperlipidemia Father   . Hypertension Father   . COPD Father   . Asthma Sister   . Cancer Sister        cervical  . Migraines Sister   . Thyroid disease Sister   . Cancer Paternal Grandmother        breast, had mastectomy  . Diabetes Paternal Grandmother   . Hyperlipidemia Paternal Grandmother   . Hypertension Paternal Grandmother   . Thyroid disease Paternal Grandmother     Social History Social History   Tobacco Use  . Smoking status: Former Games developer  . Smokeless tobacco: Never Used  Substance Use Topics  . Alcohol use: No  . Drug use: No     Review of Systems  Constitutional: No fever/chills Cardiovascular: No chest pain. Respiratory: No SOB. Gastrointestinal: No abdominal pain.  No nausea, no vomiting.  Musculoskeletal: Negative for musculoskeletal pain. Skin: Negative for abrasions, lacerations, ecchymosis. Positive for rash.   ____________________________________________   PHYSICAL EXAM:  VITAL SIGNS: ED Triage Vitals  Enc Vitals Group     BP 03/28/18 0502 113/72     Pulse Rate 03/28/18 0502 91     Resp 03/28/18 0502 16     Temp 03/28/18 0502 98 F (36.7 C)     Temp Source 03/28/18 0502 Oral  SpO2 03/28/18 0502 98 %     Weight 03/28/18 0454 248 lb (112.5 kg)     Height 03/28/18 0454 5\' 5"  (1.651 m)     Head Circumference --      Peak Flow --      Pain Score --      Pain Loc --      Pain Edu? --      Excl. in GC? --      Constitutional: Alert and oriented. Well appearing and in no acute distress. Eyes: Left eye is injected. PERRL. EOMI. Head: Atraumatic. ENT:      Ears:      Nose: No congestion/rhinnorhea.      Mouth/Throat: Mucous membranes are moist.  Neck: No stridor.  Cardiovascular: Normal rate, regular rhythm.  Good peripheral circulation. Respiratory: Normal respiratory effort  without tachypnea or retractions. Lungs CTAB. Good air entry to the bases with no decreased or absent breath sounds. Musculoskeletal: Full range of motion to all extremities. No gross deformities appreciated. Neurologic:  Normal speech and language. No gross focal neurologic deficits are appreciated.  Skin:  Skin is warm, dry and intact.  Diffuse 1/2 cm blisters and round scaling erythematous lesions to face, chest, back, abdomen, arms, legs.  Scabbing lesions to tip of nose and right ear canal.  No lesions to palms of hands or soles of feet.  Crusting to lips.  No lesions inside of mouth.  No Koplic spots. Psychiatric: Mood and affect are normal. Speech and behavior are normal. Patient exhibits appropriate insight and judgement.   ____________________________________________   LABS (all labs ordered are listed, but only abnormal results are displayed)  Labs Reviewed  COMPREHENSIVE METABOLIC PANEL - Abnormal; Notable for the following components:      Result Value   Glucose, Bld 108 (*)    Calcium 8.6 (*)    All other components within normal limits  CBC  SEDIMENTATION RATE  MONONUCLEOSIS SCREEN   ____________________________________________  EKG   ____________________________________________  RADIOLOGY   No results found.  ____________________________________________    PROCEDURES  Procedure(s) performed:    Procedures    Medications  sodium chloride 0.9 % bolus 1,000 mL (0 mLs Intravenous Stopped 03/28/18 1001)  diphenhydrAMINE (BENADRYL) injection 50 mg (50 mg Intravenous Given 03/28/18 1006)  famotidine (PEPCID) IVPB 20 mg premix (0 mg Intravenous Stopped 03/28/18 1110)  methylPREDNISolone sodium succinate (SOLU-MEDROL) 125 mg/2 mL injection 125 mg (125 mg Intravenous Given 03/28/18 1008)     ____________________________________________   INITIAL IMPRESSION / ASSESSMENT AND PLAN / ED COURSE  Pertinent labs & imaging results that were available during my  care of the patient were reviewed by me and considered in my medical decision making (see chart for details).  Review of the [place] CSRS was performed in accordance of the [facility] prior to dispensing any controlled drugs.    Patient presented to the emergency department for evaluation of diffuse worsening rash for 2 weeks.  Given patient's recent course of Augmentin and Keflex I am suspicious for a drug reaction.  I am concerned for TEN/SJS.  Dr. Juliette Alcide was consulted and came to see the patient.  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  Patient was given fluids, IV Solu-Medrol, IV Benadryl, IV Pepcid.  Lab work is unremarkable.  He agrees with my plan of admission.    Report was given to Dr. Hilda Lias and he came to personally evaluate the patient for admission.  Dr. Cherlynn Kaiser disagrees that the patient  requires admission, and Dr. Cherlynn Kaiser discharged the patient from the ED. He set up follow up for patient with dermatology tomorrow and gave patient her discharge prescriptions.      ____________________________________________  FINAL CLINICAL IMPRESSION(S) / ED DIAGNOSES  Final diagnoses:  Rash      NEW MEDICATIONS STARTED DURING THIS VISIT:    This chart was dictated using voice recognition software/Dragon. Despite best efforts to proofread, errors can occur which can change the meaning. Any change was purely unintentional.    Enid Derry, PA-C 03/28/18 1621    Arnaldo Natal, MD 03/28/18 [phone]

## 2018-05-19 ENCOUNTER — Encounter: Payer: Medicaid Other | Admitting: Certified Nurse Midwife

## 2018-06-26 ENCOUNTER — Other Ambulatory Visit: Payer: Self-pay | Admitting: Certified Nurse Midwife

## 2018-06-26 ENCOUNTER — Ambulatory Visit: Payer: BLUE CROSS/BLUE SHIELD

## 2018-06-26 ENCOUNTER — Telehealth: Payer: Self-pay

## 2018-06-26 DIAGNOSIS — O209 Hemorrhage in early pregnancy, unspecified: Secondary | ICD-10-CM

## 2018-06-26 NOTE — Progress Notes (Signed)
Order placed for u/s due to bleeding and cramping first trimester pregnancy. Jerrol Banana, [name].

## 2018-06-26 NOTE — Telephone Encounter (Signed)
A voicemail message was left for pt by [facility] informing pt of ultrasound scheduled for today [facility] [place] at 130. Pt will need to have a full bladder. Mychart message also sent to pt.

## 2018-06-27 ENCOUNTER — Encounter: Payer: BLUE CROSS/BLUE SHIELD | Admitting: Certified Nurse Midwife

## 2018-06-27 ENCOUNTER — Ambulatory Visit: Payer: BLUE CROSS/BLUE SHIELD

## 2019-01-19 ENCOUNTER — Telehealth: Payer: Self-pay

## 2019-01-19 NOTE — Telephone Encounter (Signed)
Coronavirus (COVID-19) Are you at risk?  Are you at risk for the Coronavirus (COVID-19)?  To be considered HIGH RISK for Coronavirus (COVID-19), you have to meet the following criteria:  . Traveled to [place], [place], [place], [place] or [place]; or in the [place] to [place], [place], [place], or [place]; and have fever, cough, and shortness of breath within the last 2 weeks of travel OR . Been in close contact with a person diagnosed with COVID-19 within the last 2 weeks and have fever, cough, and shortness of breath . IF YOU DO NOT MEET THESE CRITERIA, YOU ARE CONSIDERED LOW RISK FOR COVID-19.  What to do if you are HIGH RISK for COVID-19?  Marland Kitchen If you are having a medical emergency, call 911. . Seek medical care right away. Before you go to a doctor's office, urgent care or emergency department, call ahead and tell them about your recent travel, contact with someone diagnosed with COVID-19, and your symptoms. You should receive instructions from your physician's office regarding next steps of care.  . When you arrive at healthcare provider, tell the healthcare staff immediately you have returned from visiting [place], [place], [place], [place] or [place]; or traveled in the [place] to [place], [place], [place], or [place]; in the last two weeks or you have been in close contact with a person diagnosed with COVID-19 in the last 2 weeks.   . Tell the health care staff about your symptoms: fever, cough and shortness of breath. . After you have been seen by a medical provider, you will be either: o Tested for (COVID-19) and discharged home on quarantine except to seek medical care if symptoms worsen, and asked to  - Stay home and avoid contact with others until you get your results (4-5 days)  - Avoid travel on public transportation if possible (such as bus, train, or airplane) or o Sent to the Emergency Department by EMS for evaluation, COVID-19 testing, and possible  admission depending on your condition and test results.  What to do if you are LOW RISK for COVID-19?  Reduce your risk of any infection by using the same precautions used for avoiding the common cold or flu:  Marland Kitchen Wash your hands often with soap and warm water for at least 20 seconds.  If soap and water are not readily available, use an alcohol-based hand sanitizer with at least 60% alcohol.  . If coughing or sneezing, cover your mouth and nose by coughing or sneezing into the elbow areas of your shirt or coat, into a tissue or into your sleeve (not your hands). . Avoid shaking hands with others and consider head nods or verbal greetings only. . Avoid touching your eyes, nose, or mouth with unwashed hands.  . Avoid close contact with people who are Cheryl Mills. . Avoid places or events with large numbers of people in one location, like concerts or sporting events. . Carefully consider travel plans you have or are making. . If you are planning any travel outside or inside the [place], visit the CDC's Travelers' Health webpage for the latest health notices. . If you have some symptoms but not all symptoms, continue to monitor at home and seek medical attention if your symptoms worsen. . If you are having a medical emergency, call 911.  01/19/19 SCREENING NEG SLS ADDITIONAL HEALTHCARE OPTIONS FOR PATIENTS  [facility] / e-Visit: [email or web address]         [facility]: [phone]  [facility]: [phone]                   [facility]: [phone]  

## 2019-01-22 ENCOUNTER — Encounter: Payer: Self-pay | Admitting: Certified Nurse Midwife

## 2019-01-22 ENCOUNTER — Other Ambulatory Visit (HOSPITAL_COMMUNITY)
Admission: RE | Admit: 2019-01-22 | Discharge: 2019-01-22 | Disposition: A | Discharge status: Home / Self Care | Payer: BC Managed Care – PPO | Source: Ambulatory Visit | Attending: Certified Nurse Midwife | Admitting: Certified Nurse Midwife

## 2019-01-22 ENCOUNTER — Ambulatory Visit (INDEPENDENT_AMBULATORY_CARE_PROVIDER_SITE_OTHER): Payer: BC Managed Care – PPO | Admitting: Certified Nurse Midwife

## 2019-01-22 ENCOUNTER — Other Ambulatory Visit: Payer: Self-pay

## 2019-01-22 VITALS — BP 94/61 | HR 82 | Wt 260.2 lb

## 2019-01-22 DIAGNOSIS — Z3402 Encounter for supervision of normal first pregnancy, second trimester: Secondary | ICD-10-CM | POA: Insufficient documentation

## 2019-01-22 DIAGNOSIS — O99212 Obesity complicating pregnancy, second trimester: Secondary | ICD-10-CM

## 2019-01-22 DIAGNOSIS — O99210 Obesity complicating pregnancy, unspecified trimester: Secondary | ICD-10-CM | POA: Insufficient documentation

## 2019-01-22 MED ORDER — ASPIRIN 81 MG PO TBEC: 81.0000 mg | DELAYED_RELEASE_TABLET | Freq: Every day | ORAL | 12 refills | Status: DC

## 2019-01-22 NOTE — Patient Instructions (Signed)

## 2019-01-22 NOTE — Progress Notes (Signed)
NEW OB HISTORY AND PHYSICAL  SUBJECTIVE:       Cheryl Mills is a 23 y.o. [id number] female, No LMP recorded., Estimated Date of Delivery: None noted., Unknown, presents today for establishment of Prenatal Care. She has no unusual complaints .  Body mass index is 43.3 kg/m.    Gynecologic History No LMP recorded. Normal Contraception: none Last Pap: 2015. Results were: normal  Obstetric History OB History  Gravida Para Term Preterm AB Living  1 1 1     1   SAB TAB Ectopic Multiple Live Births        0 1    # Outcome Date GA Lbr Len/2nd Weight Sex Delivery Anes PTL Lv  1 Term 10/11/17 [id number] 07:13 / 00:53 8 lb 1.5 oz (3.67 kg) F Vag-Spont EPI  LIV    Past Medical History:  Diagnosis Date  . Herpes, genital    age [age]y first and only breakfast  . Missed menses   . PCOS (polycystic ovarian syndrome)     Past Surgical History:  Procedure Laterality Date  . TYMPANOSTOMY TUBE PLACEMENT      Current Outpatient Medications on File Prior to Visit  Medication Sig Dispense Refill  . amoxicillin-clavulanate (AUGMENTIN) 875-125 MG tablet Take 1 tablet by mouth every 12 (twelve) hours. 20 tablet 0  . clobetasol cream (TEMOVATE) 0.05 % Apply 1 application topically 2 (two) times daily. 60 g 0  . fexofenadine (ALLEGRA) 180 MG tablet Take 1 tablet (180 mg total) by mouth daily. 30 tablet 0   No current facility-administered medications on file prior to visit.     Allergies  Allergen Reactions  . Amoxil [Amoxicillin] Rash    Social History   Socioeconomic History  . Marital status: Single    Spouse name: Not on file  . Number of children: Not on file  . Years of education: Not on file  . Highest education level: Not on file  Occupational History  . Not on file  Social Needs  . Financial resource strain: Not on file  . Food insecurity    Worry: Not on file    Inability: Not on file  . Transportation needs    Medical: Not on file    Non-medical: Not on file  Tobacco Use   . Smoking status: Former Games developermoker  . Smokeless tobacco: Never Used  Substance and Sexual Activity  . Alcohol use: No  . Drug use: No  . Sexual activity: Yes    Partners: Male    Birth control/protection: None  Lifestyle  . Physical activity    Days per week: Not on file    Minutes per session: Not on file  . Stress: Not on file  Relationships  . Social Musicianconnections    Talks on phone: Not on file    Gets together: Not on file    Attends religious service: Not on file    Active member of club or organization: Not on file    Attends meetings of clubs or organizations: Not on file    Relationship status: Not on file  . Intimate partner violence    Fear of current or ex partner: Not on file    Emotionally abused: Not on file    Physically abused: Not on file    Forced sexual activity: Not on file  Other Topics Concern  . Not on file  Social History Narrative  . Not on file    Family History  Problem Relation Age of Onset  .  Migraines Mother   . Hyperlipidemia Father   . Hypertension Father   . COPD Father   . Asthma Sister   . Cancer Sister        cervical  . Migraines Sister   . Thyroid disease Sister   . Cancer Paternal Grandmother        breast, had mastectomy  . Diabetes Paternal Grandmother   . Hyperlipidemia Paternal Grandmother   . Hypertension Paternal Grandmother   . Thyroid disease Paternal Grandmother     The following portions of the patient's history were reviewed and updated as appropriate: allergies, current medications, past OB history, past medical history, past surgical history, past family history, past social history, and problem list.    OBJECTIVE: Initial Physical Exam (New OB)  GENERAL APPEARANCE: alert, well appearing, in no apparent distress, oriented to person, place and time, overweight HEAD: normocephalic, atraumatic MOUTH: mucous membranes moist, pharynx normal without lesions THYROID: no thyromegaly or masses present BREASTS: no  masses noted, no significant tenderness, no palpable axillary nodes, no skin changes LUNGS: clear to auscultation, no wheezes, rales or rhonchi, symmetric air entry HEART: regular rate and rhythm, no murmurs ABDOMEN: soft, nontender, nondistended, no abnormal masses, no epigastric pain EXTREMITIES: no redness or tenderness in the calves or thighs SKIN: normal coloration and turgor, no rashes LYMPH NODES: no adenopathy palpable NEUROLOGIC: alert, oriented, normal speech, no focal findings or movement disorder noted  PELVIC EXAM EXTERNAL GENITALIA: normal appearing vulva with no masses, tenderness or lesions VAGINA: no abnormal discharge or lesions CERVIX: no lesions or cervical motion tenderness, pap collected contact bleeding present.  UTERUS: gravid ADNEXA: no masses palpable and nontender OB EXAM PELVIMETRY: appears adequate RECTUM: exam not indicated  ASSESSMENT: Normal pregnancy  PLAN: New OB counseling: The patient has been given an overview regarding routine prenatal care. Recommendations regarding diet, weight gain, and exercise in pregnancy were given. Prenatal testing, optional genetic testing, carrier screening, and ultrasound use in pregnancy were reviewed. Benefits of Breast Feeding were discussed. The patient is encouraged to consider nursing her baby post partum. She will return 1-2 wks for early glucose screen. Starting 81 mg aspirin due to BMI. ROB and anatomy u/s 4 wks   Philip Aspen, [name]

## 2019-01-23 LAB — URINALYSIS, ROUTINE W REFLEX MICROSCOPIC
Bilirubin, UA: NEGATIVE
Glucose, UA: NEGATIVE
Ketones, UA: NEGATIVE
Leukocytes,UA: NEGATIVE
Nitrite, UA: NEGATIVE
RBC, UA: NEGATIVE
Specific Gravity, UA: >=1.03 — AB (ref 1.005–1.030)
Urobilinogen, Ur: 0.2 mg/dL (ref 0.2–1.0)
pH, UA: 5.5 (ref 5.0–7.5)

## 2019-01-24 ENCOUNTER — Other Ambulatory Visit: Payer: Self-pay | Admitting: Certified Nurse Midwife

## 2019-01-24 LAB — HEPATITIS B SURFACE ANTIGEN: Hepatitis B Surface Ag: NEGATIVE

## 2019-01-24 LAB — CBC WITH DIFFERENTIAL
Basophils Absolute: 0 10*3/uL (ref 0.0–0.2)
Basos: 0 %
EOS (ABSOLUTE): 0.1 10*3/uL (ref 0.0–0.4)
Eos: 1 %
Hematocrit: 37.3 % (ref 34.0–46.6)
Hemoglobin: 12.2 g/dL (ref 11.1–15.9)
Immature Grans (Abs): 0.1 10*3/uL (ref 0.0–0.1)
Immature Granulocytes: 1 %
Lymphocytes Absolute: 1.7 10*3/uL (ref 0.7–3.1)
Lymphs: 17 %
MCH: 28.6 pg (ref 26.6–33.0)
MCHC: 32.7 g/dL (ref 31.5–35.7)
MCV: 87 fL (ref 79–97)
Monocytes Absolute: 0.6 10*3/uL (ref 0.1–0.9)
Monocytes: 6 %
Neutrophils Absolute: 7.9 10*3/uL — ABNORMAL HIGH (ref 1.4–7.0)
Neutrophils: 75 %
RBC: 4.27 x10E6/uL (ref 3.77–5.28)
RDW: 14.3 % (ref 11.7–15.4)
WBC: 10.4 10*3/uL (ref 3.4–10.8)

## 2019-01-24 LAB — ABO AND RH: Rh Factor: NEGATIVE

## 2019-01-24 LAB — URINE CULTURE: Organism ID, Bacteria: NO GROWTH

## 2019-01-24 LAB — CYTOLOGY - PAP: Diagnosis: NEGATIVE

## 2019-01-24 LAB — RUBELLA SCREEN: Rubella Antibodies, IGG: 1.45 index (ref >0.99)

## 2019-01-24 LAB — RPR: RPR Ser Ql: NONREACTIVE

## 2019-01-24 LAB — HEMOGLOBIN A1C
Est. average glucose Bld gHb Est-mCnc: 108 mg/dL
Hgb A1c MFr Bld: 5.4 % (ref 4.8–5.6)

## 2019-01-24 LAB — VARICELLA ZOSTER ANTIBODY, IGG: Varicella zoster IgG: 203 index (ref >165)

## 2019-01-24 LAB — TSH: TSH: 2.77 u[IU]/mL (ref 0.450–4.500)

## 2019-01-24 LAB — ANTIBODY SCREEN: Antibody Screen: NEGATIVE

## 2019-01-24 LAB — GC/CHLAMYDIA PROBE AMP
Chlamydia trachomatis, NAA: POSITIVE — AB
Neisseria Gonorrhoeae by PCR: NEGATIVE

## 2019-01-24 MED ORDER — AZITHROMYCIN 500 MG PO TABS: 1000.0000 mg | ORAL_TABLET | Freq: Once | ORAL | 1 refills | Status: AC

## 2019-01-24 NOTE — Progress Notes (Signed)
Pt positive for chlamydia. Orders placed for treatment. Pt notified via my chart.   Philip Aspen, [name]

## 2019-01-31 ENCOUNTER — Other Ambulatory Visit: Payer: BC Managed Care – PPO

## 2019-01-31 ENCOUNTER — Other Ambulatory Visit: Payer: Self-pay

## 2019-01-31 DIAGNOSIS — Z3402 Encounter for supervision of normal first pregnancy, second trimester: Secondary | ICD-10-CM

## 2019-02-01 LAB — GLUCOSE TOLERANCE, 1 HOUR: Glucose, 1Hr PP: 89 mg/dL (ref 65–199)

## 2019-02-20 ENCOUNTER — Encounter: Payer: BC Managed Care – PPO | Admitting: Obstetrics and Gynecology

## 2019-02-20 ENCOUNTER — Other Ambulatory Visit: Payer: BC Managed Care – PPO

## 2019-02-28 ENCOUNTER — Ambulatory Visit (INDEPENDENT_AMBULATORY_CARE_PROVIDER_SITE_OTHER): Payer: BC Managed Care – PPO

## 2019-02-28 ENCOUNTER — Ambulatory Visit (INDEPENDENT_AMBULATORY_CARE_PROVIDER_SITE_OTHER): Payer: BC Managed Care – PPO | Admitting: Obstetrics and Gynecology

## 2019-02-28 ENCOUNTER — Other Ambulatory Visit: Payer: Self-pay

## 2019-02-28 VITALS — BP 93/74 | HR 67 | Wt 265.7 lb

## 2019-02-28 DIAGNOSIS — Z3402 Encounter for supervision of normal first pregnancy, second trimester: Secondary | ICD-10-CM

## 2019-02-28 DIAGNOSIS — A749 Chlamydial infection, unspecified: Secondary | ICD-10-CM

## 2019-02-28 DIAGNOSIS — Z3492 Encounter for supervision of normal pregnancy, unspecified, second trimester: Secondary | ICD-10-CM

## 2019-02-28 DIAGNOSIS — O9989 Other specified diseases and conditions complicating pregnancy, childbirth and the puerperium: Secondary | ICD-10-CM

## 2019-02-28 LAB — POCT URINALYSIS DIPSTICK OB
Bilirubin, UA: NEGATIVE
Blood, UA: NEGATIVE
Glucose, UA: NEGATIVE
Ketones, UA: NEGATIVE
Leukocytes, UA: NEGATIVE
Nitrite, UA: NEGATIVE
POC,PROTEIN,UA: NEGATIVE
Spec Grav, UA: 1.01 (ref 1.010–1.025)
Urobilinogen, UA: 0.2 E.U./dL
pH, UA: 6 (ref 5.0–8.0)

## 2019-02-28 NOTE — Progress Notes (Signed)
ROB- anatomy scan done today, pt is doing well 

## 2019-02-28 NOTE — Progress Notes (Signed)
Anatomy scan & ROB-declined flu vaccine, urine for TOC done today, states she is no longer with FOB as this is second time he has cheated and gave her an STD.  Discussed breast feeding- states she had trouble with milk production last time but wants to try again.  Anatomy scan reviewed: Date of Service: 02/28/2019   Indications:Anatomy Ultrasound Findings:  Singleton intrauterine pregnancy is visualized with FHR at 149 BPM. Biometrics give an (U/S) Gestational age of [redacted]w[redacted]d and an (U/S) EDD of 07/12/2019; this correlates with the clinically established Estimated Date of Delivery: 07/14/19  Fetal presentation is Variable.  EFW: 375 g (13 oz). Placenta: posterior. Grade: 1 AFI: subjectively normal.  Anatomic survey is complete and normal; Gender - female.    Right Ovary is normal in appearance. Left Ovary is normal appearance. Survey of the adnexa demonstrates no adnexal masses. There is no free peritoneal fluid in the cul de sac.  Impression: 1. [redacted]w[redacted]d Viable Singleton Intrauterine pregnancy by U/S. 2. (U/S) EDD is consistent with Clinically established Estimated Date of Delivery: 07/14/19 . 3. Normal Anatomy Scan

## 2019-03-02 LAB — GC/CHLAMYDIA PROBE AMP
Chlamydia trachomatis, NAA: NEGATIVE
Neisseria Gonorrhoeae by PCR: NEGATIVE

## 2019-03-29 ENCOUNTER — Ambulatory Visit (INDEPENDENT_AMBULATORY_CARE_PROVIDER_SITE_OTHER): Payer: BC Managed Care – PPO | Admitting: Certified Nurse Midwife

## 2019-03-29 ENCOUNTER — Other Ambulatory Visit: Payer: Self-pay

## 2019-03-29 VITALS — BP 99/67 | HR 91 | Wt 272.5 lb

## 2019-03-29 DIAGNOSIS — O99210 Obesity complicating pregnancy, unspecified trimester: Secondary | ICD-10-CM

## 2019-03-29 DIAGNOSIS — Z3A24 24 weeks gestation of pregnancy: Secondary | ICD-10-CM

## 2019-03-29 DIAGNOSIS — Z3492 Encounter for supervision of normal pregnancy, unspecified, second trimester: Secondary | ICD-10-CM

## 2019-03-29 DIAGNOSIS — O99212 Obesity complicating pregnancy, second trimester: Secondary | ICD-10-CM

## 2019-03-29 DIAGNOSIS — O2602 Excessive weight gain in pregnancy, second trimester: Secondary | ICD-10-CM

## 2019-03-29 DIAGNOSIS — O2603 Excessive weight gain in pregnancy, third trimester: Secondary | ICD-10-CM

## 2019-03-29 LAB — POCT URINALYSIS DIPSTICK OB
Bilirubin, UA: NEGATIVE
Blood, UA: NEGATIVE
Glucose, UA: NEGATIVE
Ketones, UA: NEGATIVE
Nitrite, UA: NEGATIVE
POC,PROTEIN,UA: NEGATIVE
Spec Grav, UA: 1.015 (ref 1.010–1.025)
Urobilinogen, UA: 0.2 E.U./dL
pH, UA: 6 (ref 5.0–8.0)

## 2019-03-29 NOTE — Patient Instructions (Addendum)
Round Ligament Pain  The round ligament is a cord of muscle and tissue that helps support the uterus. It can become a source of pain during pregnancy if it becomes stretched or twisted as the baby grows. The pain usually begins in the second trimester (13-28 weeks) of pregnancy, and it can come and go until the baby is delivered. It is not a serious problem, and it does not cause harm to the baby. Round ligament pain is usually a short, sharp, and pinching pain, but it can also be a dull, lingering, and aching pain. The pain is felt in the lower side of the abdomen or in the groin. It usually starts deep in the groin and moves up to the outside of the hip area. The pain may occur when you:  Suddenly change position, such as quickly going from a sitting to standing position.  Roll over in bed.  Cough or sneeze.  Do physical activity. Follow these instructions at home:   Watch your condition for any changes.  When the pain starts, relax. Then try any of these methods to help with the pain: ? Sitting down. ? Flexing your knees up to your abdomen. ? Lying on your side with one pillow under your abdomen and another pillow between your legs. ? Sitting in a warm bath for 15-20 minutes or until the pain goes away.  Take over-the-counter and prescription medicines only as told by your health care provider.  Move slowly when you sit down or stand up.  Avoid long walks if they cause pain.  Stop or reduce your physical activities if they cause pain.  Keep all follow-up visits as told by your health care provider. This is important. Contact a health care provider if:  Your pain does not go away with treatment.  You feel pain in your back that you did not have before.  Your medicine is not helping. Get help right away if:  You have a fever or chills.  You develop uterine contractions.  You have vaginal bleeding.  You have nausea or vomiting.  You have diarrhea.  You have pain  when you urinate. Summary  Round ligament pain is felt in the lower abdomen or groin. It is usually a short, sharp, and pinching pain. It can also be a dull, lingering, and aching pain.  This pain usually begins in the second trimester (13-28 weeks). It occurs because the uterus is stretching with the growing baby, and it is not harmful to the baby.  You may notice the pain when you suddenly change position, when you cough or sneeze, or during physical activity.  Relaxing, flexing your knees to your abdomen, lying on one side, or taking a warm bath may help to get rid of the pain.  Get help from your health care provider if the pain does not go away or if you have vaginal bleeding, nausea, vomiting, diarrhea, or painful urination. This information is not intended to replace advice given to you by your health care provider. Make sure you discuss any questions you have with your health care provider. Document Released: 02/24/2008 Document Revised: 11/02/2017 Document Reviewed: 11/02/2017 Elsevier Patient Education  [facility]. Back Pain in Pregnancy Back pain during pregnancy is common. Back pain may be caused by several factors that are related to changes during your pregnancy. Follow these instructions at home: Managing pain, stiffness, and swelling      If directed, for sudden (acute) back pain, put ice on the painful  area. ? Put ice in a plastic bag. ? Place a towel between your skin and the bag. ? Leave the ice on for 20 minutes, 2-3 times per day.  If directed, apply heat to the affected area before you exercise. Use the heat source that your health care provider recommends, such as a moist heat pack or a heating pad. ? Place a towel between your skin and the heat source. ? Leave the heat on for 20-30 minutes. ? Remove the heat if your skin turns bright red. This is especially important if you are unable to feel pain, heat, or cold. You may have a greater risk of getting  burned.  If directed, massage the affected area. Activity  Exercise as told by your health care provider. Gentle exercise is the best way to prevent or manage back pain.  Listen to your body when lifting. If lifting hurts, ask for help or bend your knees. This uses your leg muscles instead of your back muscles.  Squat down when picking up something from the floor. Do not bend over.  Only use bed rest for short periods as told by your health care provider. Bed rest should only be used for the most severe episodes of back pain. Standing, sitting, and lying down  Do not stand in one place for long periods of time.  Use good posture when sitting. Make sure your head rests over your shoulders and is not hanging forward. Use a pillow on your lower back if necessary.  Try sleeping on your side, preferably the left side, with a pregnancy support pillow or 1-2 regular pillows between your legs. ? If you have back pain after a night's rest, your bed may be too soft. ? A firm mattress may provide more support for your back during pregnancy. General instructions  Do not wear high heels.  Eat a healthy diet. Try to gain weight within your health care provider's recommendations.  Use a maternity girdle, elastic sling, or back brace as told by your health care provider.  Take over-the-counter and prescription medicines only as told by your health care provider.  Work with a physical therapist or massage therapist to find ways to manage back pain. Acupuncture or massage therapy may be helpful.  Keep all follow-up visits as told by your health care provider. This is important. Contact a health care provider if:  Your back pain interferes with your daily activities.  You have increasing pain in other parts of your body. Get help right away if:  You develop numbness, tingling, weakness, or problems with the use of your arms or legs.  You develop severe back pain that is not controlled with  medicine.  You have a change in bowel or bladder control.  You develop shortness of breath, dizziness, or you faint.  You develop nausea, vomiting, or sweating.  You have back pain that is a rhythmic, cramping pain similar to labor pains. Labor pain is usually 1-2 minutes apart, lasts for about 1 minute, and involves a bearing down feeling or pressure in your pelvis.  You have back pain and your water breaks or you have vaginal bleeding.  You have back pain or numbness that travels down your leg.  Your back pain developed after you fell.  You develop pain on one side of your back.  You see blood in your urine.  You develop skin blisters in the area of your back pain. Summary  Back pain may be caused by several factors  that are related to changes during your pregnancy.  Follow instructions as told by your health care provider for managing pain, stiffness, and swelling.  Exercise as told by your health care provider. Gentle exercise is the best way to prevent or manage back pain.  Take over-the-counter and prescription medicines only as told by your health care provider.  Keep all follow-up visits as told by your health care provider. This is important. This information is not intended to replace advice given to you by your health care provider. Make sure you discuss any questions you have with your health care provider. Document Released: 08/25/2005 Document Revised: 09/05/2018 Document Reviewed: 11/02/2017 Elsevier Patient Education  2020 [place] for Pregnant Women While you are pregnant, your body requires additional nutrition to help support your growing baby. You also have a higher need for some vitamins and minerals, such as folic acid, calcium, iron, and vitamin D. Eating a healthy, well-balanced diet is very important for your health and your baby's health. Your need for extra calories varies for the three 6-month segments of your pregnancy (trimesters).  For most women, it is recommended to consume:  150 extra calories a day during the first trimester.  300 extra calories a day during the second trimester.  300 extra calories a day during the third trimester. What are tips for following this plan?   Do not try to lose weight or go on a diet during pregnancy.  Limit your overall intake of foods that have "empty calories." These are foods that have little nutritional value, such as sweets, desserts, candies, and sugar-sweetened beverages.  Eat a variety of foods (especially fruits and vegetables) to get a full range of vitamins and minerals.  Take a prenatal vitamin to help meet your additional vitamin and mineral needs during pregnancy, specifically for folic acid, iron, calcium, and vitamin D.  Remember to stay active. Ask your health care provider what types of exercise and activities are safe for you.  Practice good food safety and cleanliness. Wash your hands before you eat and after you prepare raw meat. Wash all fruits and vegetables well before peeling or eating. Taking these actions can help to prevent food-borne illnesses that can be very dangerous to your baby, such as listeriosis. Ask your health care provider for more information about listeriosis. What does 150 extra calories look like? Healthy options that provide 150 extra calories each day could be any of the following:  6-8 oz (170-230 g) of plain low-fat yogurt with  cup of berries.  1 apple with 2 teaspoons (11 g) of peanut butter.  Cut-up vegetables with  cup (60 g) of hummus.  8 oz (230 mL) or 1 cup of low-fat chocolate milk.  1 stick of string cheese with 1 medium orange.  1 peanut butter and jelly sandwich that is made with one slice of whole-wheat bread and 1 tsp (5 g) of peanut butter. For 300 extra calories, you could eat two of those healthy options each day. What is a healthy amount of weight to gain? The right amount of weight gain for you is based  on your BMI before you became pregnant. If your BMI:  Was less than 18 (underweight), you should gain 28-40 lb (13-18 kg).  Was 18-24.9 (normal), you should gain 25-35 lb (11-16 kg).  Was 25-29.9 (overweight), you should gain 15-25 lb (7-11 kg).  Was 30 or greater (obese), you should gain 11-20 lb (5-9 kg). What if I am  having twins or multiples? Generally, if you are carrying twins or multiples:  You may need to eat 300-600 extra calories a day.  The recommended range for total weight gain is 25-54 lb (11-25 kg), depending on your BMI before pregnancy.  Talk with your health care provider to find out about nutritional needs, weight gain, and exercise that is right for you. What foods can I eat?  Grains All grains. Choose whole grains, such as whole-wheat bread, oatmeal, or brown rice. Vegetables All vegetables. Eat a variety of colors and types of vegetables. Remember to wash your vegetables well before peeling or eating. Fruits All fruits. Eat a variety of colors and types of fruit. Remember to wash your fruits well before peeling or eating. Meats and other protein foods Lean meats, including chicken, [place], fish, and lean cuts of beef, veal, or pork. If you eat fish or seafood, choose options that are higher in omega-3 fatty acids and lower in mercury, such as salmon, herring, mussels, trout, sardines, pollock, shrimp, crab, and lobster. Tofu. Tempeh. Beans. Eggs. Peanut butter and other nut butters. Make sure that all meats, poultry, and eggs are cooked to food-safe temperatures or "well-done." Two or more servings of fish are recommended each week in order to get the most benefits from omega-3 fatty acids that are found in seafood. Choose fish that are lower in mercury. You can find more information online:  PumpkinSearch.com.ee Dairy Pasteurized milk and milk alternatives (such as almond milk). Pasteurized yogurt and pasteurized cheese. Cottage cheese. Sour cream. Beverages Water.  Juices that contain 100% fruit juice or vegetable juice. Caffeine-free teas and decaffeinated coffee. Drinks that contain caffeine are okay to drink, but it is better to avoid caffeine. Keep your total caffeine intake to less than 200 mg each day (which is 12 oz or 355 mL of coffee, tea, or soda) or the limit as told by your health care provider. Fats and oils Fats and oils are okay to include in moderation. Sweets and desserts Sweets and desserts are okay to include in moderation. Seasoning and other foods All pasteurized condiments. The items listed above may not be a complete list of recommended foods and beverages. Contact your dietitian for more options. The items listed above may not be a complete list of foods and beverages [you/your child] can eat. Contact a dietitian for more information. What foods are not recommended? Vegetables Raw (unpasteurized) vegetable juices. Fruits Unpasteurized fruit juices. Meats and other protein foods Lunch meats, bologna, hot dogs, or other deli meats. (If you must eat those meats, reheat them until they are steaming hot.) Refrigerated pat, meat spreads from a meat counter, smoked seafood that is found in the refrigerated section of a store. Raw or undercooked meats, poultry, and eggs. Raw fish, such as sushi or sashimi. Fish that have high mercury content, such as tilefish, shark, swordfish, and king mackerel. To learn more about mercury in fish, talk with your health care provider or look for online resources, such as:  PumpkinSearch.com.ee Dairy Raw (unpasteurized) milk and any foods that have raw milk in them. Soft cheeses, such as feta, queso blanco, queso fresco, Brie, Camembert cheeses, blue-veined cheeses, and Panela cheese (unless it is made with pasteurized milk, which must be stated on the label). Beverages Alcohol. Sugar-sweetened beverages, such as sodas, teas, or energy drinks. Seasoning and other foods Homemade fermented foods and drinks, such  as pickles, sauerkraut, or kombucha drinks. (Store-bought pasteurized versions of these are okay.) Salads that are made in  a store or deli, such as ham salad, chicken salad, egg salad, tuna salad, and seafood salad. The items listed above may not be a complete list of foods and beverages to avoid. Contact your dietitian for more information. The items listed above may not be a complete list of foods and beverages [you/your child] should avoid. Contact a dietitian for more information. Where to find more information To calculate the number of calories you need based on your height, weight, and activity level, you can use an online calculator such as:  www.choosemyplate.gov/MyPlatePlan To calculate how much weight you should gain during pregnancy, you can use an online pregnancy weight gain calculator such as:  www.choosemyplate.gov/pregnancy-weight-gain-calculator Summary  While you are pregnant, your body requires additional nutrition to help support your growing baby.  Eat a variety of foods, especially fruits and vegetables to get a full range of vitamins and minerals.  Practice good food safety and cleanliness. Wash your hands before you eat and after you prepare raw meat. Wash all fruits and vegetables well before peeling or eating. Taking these actions can help to prevent food-borne illnesses, such as listeriosis, that can be very dangerous to your baby.  Do not eat raw meat or fish. Do not eat fish that have high mercury content, such as tilefish, shark, swordfish, and king mackerel. Do not eat unpasteurized (raw) dairy.  Take a prenatal vitamin to help meet your additional vitamin and mineral needs during pregnancy, specifically for folic acid, iron, calcium, and vitamin D. This information is not intended to replace advice given to you by your health care provider. Make sure you discuss any questions you have with your health care provider. Document Released: 03/01/2014 Document  Revised: 09/07/2018 Document Reviewed: 02/11/2017 Elsevier Patient Education  2020 Elsevier Inc.  

## 2019-03-29 NOTE — Progress Notes (Signed)
ROB-No complaints.  

## 2019-03-29 NOTE — Progress Notes (Signed)
ROB-Doing well, no questions or concerns. Discussed TWG: 42 lbs and Body mass index is 45.35 kg/m. Message left with Pre-Admit testing to schedule anesthesia consult; patient aware. Ready, Set, Baby information provided. Anticipatory guidance regarding course of prenatal care. Reviewed red flag symptoms and when to call. RTC x 4 wks for 28 wk labs, growth ultrasound, and ROB or sooner if needed.

## 2019-04-20 ENCOUNTER — Other Ambulatory Visit: Admission: RE | Admit: 2019-04-20 | Payer: BC Managed Care – PPO | Source: Ambulatory Visit

## 2019-04-24 ENCOUNTER — Ambulatory Visit (INDEPENDENT_AMBULATORY_CARE_PROVIDER_SITE_OTHER): Payer: BC Managed Care – PPO | Admitting: Certified Nurse Midwife

## 2019-04-24 ENCOUNTER — Other Ambulatory Visit: Payer: Self-pay

## 2019-04-24 ENCOUNTER — Ambulatory Visit (INDEPENDENT_AMBULATORY_CARE_PROVIDER_SITE_OTHER): Payer: BC Managed Care – PPO

## 2019-04-24 ENCOUNTER — Encounter: Payer: Self-pay | Admitting: Certified Nurse Midwife

## 2019-04-24 VITALS — BP 101/62 | HR 86 | Wt 277.2 lb

## 2019-04-24 DIAGNOSIS — Z3492 Encounter for supervision of normal pregnancy, unspecified, second trimester: Secondary | ICD-10-CM

## 2019-04-24 DIAGNOSIS — O99210 Obesity complicating pregnancy, unspecified trimester: Secondary | ICD-10-CM | POA: Diagnosis not present

## 2019-04-24 DIAGNOSIS — O360130 Maternal care for anti-D [Rh] antibodies, third trimester, not applicable or unspecified: Secondary | ICD-10-CM

## 2019-04-24 DIAGNOSIS — Z3A28 28 weeks gestation of pregnancy: Secondary | ICD-10-CM

## 2019-04-24 DIAGNOSIS — Z23 Encounter for immunization: Secondary | ICD-10-CM

## 2019-04-24 DIAGNOSIS — Z3493 Encounter for supervision of normal pregnancy, unspecified, third trimester: Secondary | ICD-10-CM

## 2019-04-24 DIAGNOSIS — Z3A29 29 weeks gestation of pregnancy: Secondary | ICD-10-CM

## 2019-04-24 DIAGNOSIS — O2603 Excessive weight gain in pregnancy, third trimester: Secondary | ICD-10-CM

## 2019-04-24 LAB — POCT URINALYSIS DIPSTICK OB
Bilirubin, UA: NEGATIVE
Blood, UA: NEGATIVE
Glucose, UA: NEGATIVE
Ketones, UA: NEGATIVE
Leukocytes, UA: NEGATIVE
Nitrite, UA: NEGATIVE
POC,PROTEIN,UA: NEGATIVE
Spec Grav, UA: 1.015 (ref 1.010–1.025)
Urobilinogen, UA: 0.2 E.U./dL
pH, UA: 5 (ref 5.0–8.0)

## 2019-04-24 MED ORDER — RHO D IMMUNE GLOBULIN 1500 UNITS IM SOSY: 1500.0000 [IU] | PREFILLED_SYRINGE | Freq: Once | INTRAMUSCULAR | Status: DC

## 2019-04-24 MED ORDER — TETANUS-DIPHTH-ACELL PERTUSSIS 5-2.5-18.5 LF-MCG/0.5 IM SUSP: 0.5000 mL | Freq: Once | INTRAMUSCULAR | Status: DC

## 2019-04-24 NOTE — Progress Notes (Signed)
Body mass index is 46.14 kg/m. ROB doing well. Feels good movement. 28 wk labs not done today. She will return for lab appointment 1 wk. TDAP/BTC-today. Discussed bc after baby pamphlet given, Discussed birth plan, sample given. Follow up 2 wks. Ultrasound today for elevated BMI (see below). Results reviewed. Pt state she has not had anesthesia consult this pregnancy. Order placed. Follow 1 wk for labs, 2-3 wks for ROB   Philip Aspen, [name]    Patient Name: Cheryl Mills DOB: 07/21/1995 MRN: [id number] ULTRASOUND REPORT  Location: [facility] Date of Service: 04/24/2019   Indications:growth/afi Findings:  Cheryl Mills intrauterine pregnancy is visualized with FHR at 145 BPM. Biometrics give an (U/S) Gestational age of [redacted]w[redacted]d and an (U/S) EDD of 07/05/2019; this correlates with the clinically established Estimated Date of Delivery: 07/14/19.  Fetal presentation is Cephalic.  Placenta: anterior. Grade: 1 AFI: 14.3 cm  Growth percentile is 63. EFW: 1421 g ( 3lb 2oz)   Impression: 1. [redacted]w[redacted]d Viable Singleton Intrauterine pregnancy previously established criteria. 2. Growth is 63 %ile.  AFI is 14.3 cm.   Recommendations: 1.Clinical correlation with the patient's History and Physical Exam.   Jenine  M. Albertine Grates     RDMS

## 2019-04-24 NOTE — Patient Instructions (Signed)
Glucose Tolerance Test During Pregnancy Why am I having this test? The glucose tolerance test (GTT) is done to check how your body processes sugar (glucose). This is one of several tests used to diagnose diabetes that develops during pregnancy (gestational diabetes mellitus). Gestational diabetes is a temporary form of diabetes that some women develop during pregnancy. It usually occurs during the second trimester of pregnancy and goes away after delivery. Testing (screening) for gestational diabetes usually occurs between 24 and 28 weeks of pregnancy. You may have the GTT test after having a 1-hour glucose screening test if the results from that test indicate that you may have gestational diabetes. You may also have this test if:  You have a history of gestational diabetes.  You have a history of giving birth to very large babies or have experienced repeated fetal loss (stillbirth).  You have signs and symptoms of diabetes, such as: ? Changes in your vision. ? Tingling or numbness in your hands or feet. ? Changes in hunger, thirst, and urination that are not otherwise explained by your pregnancy. What is being tested? This test measures the amount of glucose in your blood at different times during a period of 3 hours. This indicates how well your body is able to process glucose. What kind of sample is taken?  Blood samples are required for this test. They are usually collected by inserting a needle into a blood vessel. How do I prepare for this test?  For 3 days before your test, eat normally. Have plenty of carbohydrate-rich foods.  Follow instructions from your health care provider about: ? Eating or drinking restrictions on the day of the test. You may be asked to not eat or drink anything other than water (fast) starting 8-10 hours before the test. ? Changing or stopping your regular medicines. Some medicines may interfere with this test. Tell a health care provider about:  All  medicines you are taking, including vitamins, herbs, eye drops, creams, and over-the-counter medicines.  Any blood disorders you have.  Any surgeries you have had.  Any medical conditions you have. What happens during the test? First, your blood glucose will be measured. This is referred to as your fasting blood glucose, since you fasted before the test. Then, you will drink a glucose solution that contains a certain amount of glucose. Your blood glucose will be measured again 1, 2, and 3 hours after drinking the solution. This test takes about 3 hours to complete. You will need to stay at the testing location during this time. During the testing period:  Do not eat or drink anything other than the glucose solution.  Do not exercise.  Do not use any products that contain nicotine or tobacco, such as cigarettes and e-cigarettes. If you need help stopping, ask your health care provider. The testing procedure may vary among health care providers and hospitals. How are the results reported? Your results will be reported as milligrams of glucose per deciliter of blood (mg/dL) or millimoles per liter (mmol/L). Your health care provider will compare your results to normal ranges that were established after testing a large group of people (reference ranges). Reference ranges may vary among labs and hospitals. For this test, common reference ranges are:  Fasting: less than 95-105 mg/dL (5.3-5.8 mmol/L).  1 hour after drinking glucose: less than 180-190 mg/dL (10.0-10.5 mmol/L).  2 hours after drinking glucose: less than 155-165 mg/dL (8.6-9.2 mmol/L).  3 hours after drinking glucose: 140-145 mg/dL (7.8-8.1 mmol/L). What do the   results mean? Results within reference ranges are considered normal, meaning that your glucose levels are well-controlled. If two or more of your blood glucose levels are high, you may be diagnosed with gestational diabetes. If only one level is high, your health care  provider may suggest repeat testing or other tests to confirm a diagnosis. Talk with your health care provider about what your results mean. Questions to ask your health care provider Ask your health care provider, or the department that is doing the test:  When will my results be ready?  How will I get my results?  What are my treatment options?  What other tests do I need?  What are my next steps? Summary  The glucose tolerance test (GTT) is one of several tests used to diagnose diabetes that develops during pregnancy (gestational diabetes mellitus). Gestational diabetes is a temporary form of diabetes that some women develop during pregnancy.  You may have the GTT test after having a 1-hour glucose screening test if the results from that test indicate that you may have gestational diabetes. You may also have this test if you have any symptoms or risk factors for gestational diabetes.  Talk with your health care provider about what your results mean. This information is not intended to replace advice given to you by your health care provider. Make sure you discuss any questions you have with your health care provider. Document Released: 11/16/2011 Document Revised: 09/07/2018 Document Reviewed: 12/27/2016 Elsevier Patient Education  2020 Elsevier Inc.  

## 2019-04-25 ENCOUNTER — Encounter: Payer: BC Managed Care – PPO | Admitting: Certified Nurse Midwife

## 2019-05-01 ENCOUNTER — Other Ambulatory Visit: Payer: BC Managed Care – PPO

## 2019-05-01 ENCOUNTER — Other Ambulatory Visit: Payer: Self-pay

## 2019-05-01 DIAGNOSIS — Z3493 Encounter for supervision of normal pregnancy, unspecified, third trimester: Secondary | ICD-10-CM

## 2019-05-01 DIAGNOSIS — Z130 Encounter for screening for diseases of the blood and blood-forming organs and certain disorders involving the immune mechanism: Secondary | ICD-10-CM

## 2019-05-01 DIAGNOSIS — Z131 Encounter for screening for diabetes mellitus: Secondary | ICD-10-CM

## 2019-05-01 DIAGNOSIS — Z113 Encounter for screening for infections with a predominantly sexual mode of transmission: Secondary | ICD-10-CM

## 2019-05-02 LAB — RPR: RPR Ser Ql: NONREACTIVE

## 2019-05-02 LAB — CBC
Hematocrit: 33.9 % — ABNORMAL LOW (ref 34.0–46.6)
Hemoglobin: 11.4 g/dL (ref 11.1–15.9)
MCH: 28.5 pg (ref 26.6–33.0)
MCHC: 33.6 g/dL (ref 31.5–35.7)
MCV: 85 fL (ref 79–97)
Platelets: 256 10*3/uL (ref 150–450)
RBC: 4 x10E6/uL (ref 3.77–5.28)
RDW: 13.2 % (ref 11.7–15.4)
WBC: 10.8 10*3/uL (ref 3.4–10.8)

## 2019-05-02 LAB — GLUCOSE, 1 HOUR GESTATIONAL: Gestational Diabetes Screen: 98 mg/dL (ref 65–139)

## 2019-05-10 ENCOUNTER — Telehealth: Payer: Self-pay

## 2019-05-10 NOTE — Telephone Encounter (Signed)
mychart message sent

## 2019-05-11 ENCOUNTER — Encounter: Payer: Self-pay | Admitting: Certified Nurse Midwife

## 2019-05-11 ENCOUNTER — Other Ambulatory Visit: Payer: Self-pay

## 2019-05-11 ENCOUNTER — Ambulatory Visit (INDEPENDENT_AMBULATORY_CARE_PROVIDER_SITE_OTHER): Payer: BC Managed Care – PPO | Admitting: Certified Nurse Midwife

## 2019-05-11 VITALS — BP 84/57 | HR 92 | Wt 277.3 lb

## 2019-05-11 DIAGNOSIS — Z3483 Encounter for supervision of other normal pregnancy, third trimester: Secondary | ICD-10-CM

## 2019-05-11 NOTE — Progress Notes (Signed)
ROB doing well. Feels good movement. Has follow up anesthesia appt 1/12. U/s next visit for growth due to elevated BMI. Pt verbalizes and agrees to plan.   Philip Aspen, [name]

## 2019-05-29 ENCOUNTER — Ambulatory Visit (INDEPENDENT_AMBULATORY_CARE_PROVIDER_SITE_OTHER): Payer: BC Managed Care – PPO

## 2019-05-29 ENCOUNTER — Ambulatory Visit (INDEPENDENT_AMBULATORY_CARE_PROVIDER_SITE_OTHER): Payer: BC Managed Care – PPO | Admitting: Certified Nurse Midwife

## 2019-05-29 ENCOUNTER — Other Ambulatory Visit: Payer: Self-pay

## 2019-05-29 VITALS — BP 95/68 | HR 90 | Wt 281.0 lb

## 2019-05-29 DIAGNOSIS — Z3403 Encounter for supervision of normal first pregnancy, third trimester: Secondary | ICD-10-CM

## 2019-05-29 DIAGNOSIS — Z3483 Encounter for supervision of other normal pregnancy, third trimester: Secondary | ICD-10-CM | POA: Diagnosis not present

## 2019-05-29 LAB — POCT URINALYSIS DIPSTICK OB
Bilirubin, UA: NEGATIVE
Blood, UA: NEGATIVE
Glucose, UA: NEGATIVE
Ketones, UA: NEGATIVE
Leukocytes, UA: NEGATIVE
Nitrite, UA: NEGATIVE
POC,PROTEIN,UA: NEGATIVE
Spec Grav, UA: <=1.005 — AB (ref 1.010–1.025)
Urobilinogen, UA: 0.2 E.U./dL
pH, UA: 5 (ref 5.0–8.0)

## 2019-05-29 NOTE — Addendum Note (Signed)
Addended by: Raliegh Ip on: 05/29/2019 03:15 PM   Modules accepted: Orders

## 2019-05-29 NOTE — Patient Instructions (Signed)

## 2019-05-29 NOTE — Progress Notes (Signed)
Body mass index is 46.76 kg/m. ROB doing well. Had u/s for growth due to elevated BMI, see below. Results reviewed with pt. Discussed anesthesia consult scheduled for 12th. NST next appointment. Reviewed recommendations to induce by 40 wks. PT verbalizes and agree to plan. Follow up 3 wks. Philip Aspen, [name]  Patient Name: Cheryl Mills DOB: Feb 26, 1996 MRN: [id number] ULTRASOUND REPORT  Location: [facility] Date of Service: 05/29/2019   Indications:growth/afi Findings:  Cheryl Mills intrauterine pregnancy is visualized with FHR at 142 BPM. Biometrics give an (U/S) Gestational age of [redacted]w[redacted]d and an (U/S) EDD of 06/30/2019; this correlates with the clinically established Estimated Date of Delivery: 07/14/19.  Fetal presentation is Cephalic.  Placenta: posterior. Grade: 1 AFI: 19.3 cm  Growth percentile is 68. EFW: 2609 g ( 5 lbs 12 oz)   Impression: 1. [redacted]w[redacted]d Viable Singleton Intrauterine pregnancy previously established criteria. 2. Growth is 68 %ile.  AFI is 19.3 cm.   Recommendations: 1.Clinical correlation with the patient's History and Physical Exam.   Jenine  M. Albertine Grates     RDMS

## 2019-06-01 NOTE — L&D Delivery Note (Signed)
       Delivery Note   Cheryl Mills is a 24 y.o. G3P1011 at [redacted]w[redacted]d Estimated Date of Delivery: 07/14/19  PRE-OPERATIVE DIAGNOSIS:  1) [redacted]w[redacted]d pregnancy.   POST-OPERATIVE DIAGNOSIS:  1) [redacted]w[redacted]d pregnancy s/p Vaginal, Spontaneous   Delivery Type: Vaginal, Spontaneous    Delivery Anesthesia: Epidural   Labor Complications:  none    ESTIMATED BLOOD LOSS: 483  ml    FINDINGS:   1) female infant, Apgar scores of 9  at 1 minute and 9  at 5 minutes and a birthweight pending, infant remains skin to skin .    2) Nuchal cord: No  SPECIMENS:   PLACENTA:   Appearance: Intact , 3 vessel cord, cord blood sample collected   Removal: Spontaneous      Disposition:  held per protocol , then discarded  DISPOSITION:  Infant to left in stable condition in the delivery room, with L&D personnel and mother,  NARRATIVE SUMMARY: Labor course:  Ms. Cheryl Mills is a U1L2440 at [redacted]w[redacted]d who presented for induction of labor.  She progressed well in labor without pitocin.  She received the appropriate epidural anesthesia and proceeded to complete dilation. She evidenced good maternal expulsive effort during the second stage. She went on to deliver a viable female  Infant . The placenta delivered without problems and was noted to be complete. A perineal and vaginal examination was performed. Episiotomy/Lacerations: 2nd degree;Perineal  That was repaired with 3-0  Vicryl Rapide suture. The patient tolerated this well.  Doreene Burke, [name]  07/11/2019 10:43 AM

## 2019-06-12 ENCOUNTER — Ambulatory Visit (INDEPENDENT_AMBULATORY_CARE_PROVIDER_SITE_OTHER): Payer: BC Managed Care – PPO | Admitting: Certified Nurse Midwife

## 2019-06-12 ENCOUNTER — Other Ambulatory Visit (INDEPENDENT_AMBULATORY_CARE_PROVIDER_SITE_OTHER): Payer: BC Managed Care – PPO

## 2019-06-12 ENCOUNTER — Encounter
Admission: RE | Admit: 2019-06-12 | Discharge: 2019-06-12 | Disposition: A | Discharge status: Home / Self Care | Payer: BC Managed Care – PPO | Source: Ambulatory Visit | Attending: Anesthesiology | Admitting: Anesthesiology

## 2019-06-12 ENCOUNTER — Other Ambulatory Visit: Payer: Self-pay

## 2019-06-12 VITALS — BP 105/74 | HR 103 | Wt 286.4 lb

## 2019-06-12 DIAGNOSIS — Z3480 Encounter for supervision of other normal pregnancy, unspecified trimester: Secondary | ICD-10-CM

## 2019-06-12 DIAGNOSIS — Z0289 Encounter for other administrative examinations: Secondary | ICD-10-CM

## 2019-06-12 DIAGNOSIS — Z3483 Encounter for supervision of other normal pregnancy, third trimester: Secondary | ICD-10-CM | POA: Diagnosis not present

## 2019-06-12 DIAGNOSIS — Z3A35 35 weeks gestation of pregnancy: Secondary | ICD-10-CM | POA: Diagnosis not present

## 2019-06-12 NOTE — Consult Note (Addendum)
[facility] Anesthesia Consultation  RACHELE LAMASTER DTO:[id number] DOB: [birth year] DOA: 06/12/2019 PCP: [facility]   Requesting physician: Philip Aspen, [name] Date of consultation: 06/12/19 Reason for consultation: Obesity during pregnancy  CHIEF COMPLAINT:  Obesity during pregnancy  HISTORY OF PRESENT ILLNESS: Cheryl Mills  is a 24 y.o. female with a known history of obesity in pregnancy. She has had one prior vaginal delivery with epidural analgesia. Denies hx of cardiovascular disease. Denies hx of asthma. Denies personal or family hx of bleeding disorders.   PAST MEDICAL HISTORY:   Past Medical History:  Diagnosis Date  . Herpes, genital    age 24y first and only breakfastreakfast  . Missed menses   . PCOS (polycystic ovarian syndrome)     PAST SURGICAL HISTORY:  Past Surgical History:  Procedure Laterality Date  . TYMPANOSTOMY TUBE PLACEMENT      SOCIAL HISTORY:  Social History   Tobacco Use  . Smoking status: Former Research scientist (life sciences)  . Smokeless tobacco: Never Used  Substance Use Topics  . Alcohol use: No    FAMILY HISTORY:  Family History  Problem Relation Age of Onset  . Migraines Mother   . Hyperlipidemia Father   . Hypertension Father   . COPD Father   . Asthma Sister   . Cancer Sister        cervical  . Migraines Sister   . Thyroid disease Sister   . Cancer Paternal Grandmother        breast, had mastectomy  . Diabetes Paternal Grandmother   . Hyperlipidemia Paternal Grandmother   . Hypertension Paternal Grandmother   . Thyroid disease Paternal Grandmother     DRUG ALLERGIES:  Allergies  Allergen Reactions  . Amoxil [Amoxicillin] Rash    REVIEW OF SYSTEMS:   RESPIRATORY: No cough, shortness of breath, wheezing.  CARDIOVASCULAR: No chest pain, orthopnea, edema.  HEMATOLOGY: No anemia, easy bruising or bleeding SKIN: No rash or lesion. NEUROLOGIC: No tingling, numbness, weakness.  PSYCHIATRY: No anxiety  or depression.   MEDICATIONS AT HOME:  Prior to Admission medications   Medication Sig Start Date End Date Taking? Authorizing Provider  aspirin 81 MG EC tablet Take 1 tablet (81 mg total) by mouth daily. Swallow whole. 01/22/19   Philip Aspen, [name]  Prenatal 28-0.8 MG TABS Take by mouth.    [provider]      PHYSICAL EXAMINATION:   VITAL SIGNS: not currently breastfeeding.  GENERAL:  24 y.o.-year-old patient no acute distress.  HEENT: Head atraumatic, normocephalic. Oropharynx and nasopharynx clear. MP 2, TM distance >3 cm, normal mouth opening, grade 1 upper lip bite LUNGS: Normal breath sounds bilaterally, no wheezing, rales,rhonchi. No use of accessory muscles of respiration.  CARDIOVASCULAR: S1, S2 normal. No murmurs, rubs, or gallops.  EXTREMITIES: No pedal edema, cyanosis, or clubbing.  NEUROLOGIC: normal gait PSYCHIATRIC: The patient is alert and oriented x 3.  SKIN: No obvious rash, lesion, or ulcer.    IMPRESSION AND PLAN:   Cheryl Mills  is a 24 y.o. female presenting with obesity during pregnancy. BMI is currently 47 at [redacted] weeks gestation.   Airway exam reassuring today. Spinal interspaces minimally palpable.   We discussed analgesic options during labor including epidural analgesia. Discussed that in obesity there can be increased difficulty with epidural placement or even failure of successful epidural. We also discussed that even after successful epidural placement there is increased risk of catheter migration out of the epidural space that would require catheter replacement. Discussed  use of epidural vs spinal vs GA if cesarean delivery is required. Discussed increased risk of difficult intubation during pregnancy should an emergency cesarean delivery be required.   Plan for delivery at [facility].

## 2019-06-12 NOTE — Patient Instructions (Signed)
Nonstress Test A nonstress test is a procedure that is done during pregnancy in order to check the baby's heartbeat. The procedure can help show if the baby (fetus) is healthy. It is commonly done when:  The baby is past his or her due date.  The pregnancy is high risk.  The baby is moving less than normal.  The mother has lost a pregnancy in the past.  The health care provider suspects a problem with the baby's growth.  There is too much or too little amniotic fluid. The procedure is often done in the third trimester of pregnancy to find out if an early delivery is needed and whether such a delivery is safe. During a nonstress test, the baby's heartbeat is monitored when the baby is resting and when the baby is moving. If the baby is healthy, the heart rate will increase when he or she moves or kicks and will return to normal when he or she rests. Tell a health care provider about:  Any allergies you have.  Any medical conditions you have.  All medicines you are taking, including vitamins, herbs, eye drops, creams, and over-the-counter medicines. What are the risks? There are no risks to you or your baby from a nonstress test. This procedure should not be painful or uncomfortable. What happens before the procedure?  Eat a meal right before the test or as directed by your health care provider. Food may help encourage the baby to move.  Use the restroom right before the test. What happens during the procedure?  Two monitors will be placed on your abdomen. One will record the baby's heart rate and the other will record the contractions of your uterus.  You may be asked to lie down on your side or to sit upright.  You may be given a button to press when you feel your baby move.  Your health care provider will listen to your baby's heartbeat and recorded it. He or she may also watch your baby's heartbeat on a screen.  If the baby seems to be sleeping, you may be asked to drink  some juice or soda, eat a snack, or change positions. The procedure may vary among health care providers and hospitals. What happens after the procedure?  Your health care provider will discuss the test results with you and make recommendations for the future. Depending on the results, your health care provider may order additional tests or another course of action.  If your health care provider gave you any diet or activity instructions, make sure to follow them.  Keep all follow-up visits as told by your health care provider. This is important. Summary  A nonstress test is a procedure that is done during pregnancy in order to check the baby's heartbeat. The procedure can help show if the baby is healthy.  The procedure is often done in the third trimester of pregnancy to find out if an early delivery is needed and whether such a delivery is safe.  During a nonstress test, the baby's heartbeat is monitored when the baby is resting and when the baby is moving. If the baby is healthy, the heart rate will increase when he or she moves or kicks and will return to normal when he or she rests.  Your health care provider will discuss the test results with you and make recommendations for the future. This information is not intended to replace advice given to you by your health care provider. Make sure you discuss any   questions you have with your health care provider. Document Revised: 08/26/2016 Document Reviewed: 08/26/2016 Elsevier Patient Education  2020 Elsevier Inc.  

## 2019-06-12 NOTE — Progress Notes (Signed)
NST performed today for elevated BMI. It was reviewed and was found to be reactive. Baseline 150 with Moderate variability; No decels noted. No contractions.  Continue recommended antenatal testing and prenatal care.  Follow up 1 wk ROB and NST. Discussed GBS testing next visit. Pt verbalizes understanding.

## 2019-06-12 NOTE — Progress Notes (Signed)
NONSTRESS TEST INTERPRETATION  INDICATIONS:   FHR baseline:  RESULTS: COMMENTS:    PLAN: 1. Continue fetal kick counts twice a day. 2. Continue antepartum testing as scheduled-Biweekly 3

## 2019-06-15 ENCOUNTER — Telehealth: Payer: Self-pay

## 2019-06-15 NOTE — Telephone Encounter (Signed)
mychart message sent to patient- FMLA papers ready for pickup. Put in file cabinet up front.

## 2019-06-15 NOTE — Telephone Encounter (Signed)
I just signed these paper. I do not have a medical indication to take her out of work early. She can get a note to allow her to sit every 2-3 hrs for 10-15 min. Also to limit hours of wok to 8 hr shifts. Please let her know.  Thanks,  Pattricia Boss

## 2019-06-15 NOTE — Telephone Encounter (Signed)
mychart message sent to patient

## 2019-06-15 NOTE — Telephone Encounter (Signed)
Pt would like to be taken out of work early. Says job functions are overwhelming. 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   *FMLA paper work has not been rec by her job. Would like to be taken out of work on 06/22/19. Please call patient

## 2019-06-19 ENCOUNTER — Other Ambulatory Visit: Payer: Self-pay

## 2019-06-19 ENCOUNTER — Ambulatory Visit (INDEPENDENT_AMBULATORY_CARE_PROVIDER_SITE_OTHER): Payer: BC Managed Care – PPO | Admitting: Certified Nurse Midwife

## 2019-06-19 ENCOUNTER — Encounter: Payer: Self-pay | Admitting: Certified Nurse Midwife

## 2019-06-19 VITALS — BP 109/69 | HR 101 | Wt 288.3 lb

## 2019-06-19 DIAGNOSIS — Z3480 Encounter for supervision of other normal pregnancy, unspecified trimester: Secondary | ICD-10-CM | POA: Diagnosis not present

## 2019-06-19 DIAGNOSIS — Z3A36 36 weeks gestation of pregnancy: Secondary | ICD-10-CM | POA: Diagnosis not present

## 2019-06-19 LAB — POCT URINALYSIS DIPSTICK OB
Bilirubin, UA: NEGATIVE
Blood, UA: NEGATIVE
Glucose, UA: NEGATIVE
Ketones, UA: NEGATIVE
Leukocytes, UA: NEGATIVE
Nitrite, UA: NEGATIVE
POC,PROTEIN,UA: NEGATIVE
Spec Grav, UA: 1.025 (ref 1.010–1.025)
Urobilinogen, UA: 0.2 E.U./dL
pH, UA: 5 (ref 5.0–8.0)

## 2019-06-19 NOTE — Patient Instructions (Signed)
Nonstress Test A nonstress test is a procedure that is done during pregnancy in order to check the baby's heartbeat. The procedure can help show if the baby (fetus) is healthy. It is commonly done when:  The baby is past his or her due date.  The pregnancy is high risk.  The baby is moving less than normal.  The mother has lost a pregnancy in the past.  The health care provider suspects a problem with the baby's growth.  There is too much or too little amniotic fluid. The procedure is often done in the third trimester of pregnancy to find out if an early delivery is needed and whether such a delivery is safe. During a nonstress test, the baby's heartbeat is monitored when the baby is resting and when the baby is moving. If the baby is healthy, the heart rate will increase when he or she moves or kicks and will return to normal when he or she rests. Tell a health care provider about:  Any allergies you have.  Any medical conditions you have.  All medicines you are taking, including vitamins, herbs, eye drops, creams, and over-the-counter medicines. What are the risks? There are no risks to you or your baby from a nonstress test. This procedure should not be painful or uncomfortable. What happens before the procedure?  Eat a meal right before the test or as directed by your health care provider. Food may help encourage the baby to move.  Use the restroom right before the test. What happens during the procedure?  Two monitors will be placed on your abdomen. One will record the baby's heart rate and the other will record the contractions of your uterus.  You may be asked to lie down on your side or to sit upright.  You may be given a button to press when you feel your baby move.  Your health care provider will listen to your baby's heartbeat and recorded it. He or she may also watch your baby's heartbeat on a screen.  If the baby seems to be sleeping, you may be asked to drink  some juice or soda, eat a snack, or change positions. The procedure may vary among health care providers and hospitals. What happens after the procedure?  Your health care provider will discuss the test results with you and make recommendations for the future. Depending on the results, your health care provider may order additional tests or another course of action.  If your health care provider gave you any diet or activity instructions, make sure to follow them.  Keep all follow-up visits as told by your health care provider. This is important. Summary  A nonstress test is a procedure that is done during pregnancy in order to check the baby's heartbeat. The procedure can help show if the baby is healthy.  The procedure is often done in the third trimester of pregnancy to find out if an early delivery is needed and whether such a delivery is safe.  During a nonstress test, the baby's heartbeat is monitored when the baby is resting and when the baby is moving. If the baby is healthy, the heart rate will increase when he or she moves or kicks and will return to normal when he or she rests.  Your health care provider will discuss the test results with you and make recommendations for the future. This information is not intended to replace advice given to you by your health care provider. Make sure you discuss any   questions you have with your health care provider. Document Revised: 08/26/2016 Document Reviewed: 08/26/2016 Elsevier Patient Education  2020 Elsevier Inc.  

## 2019-06-19 NOTE — Progress Notes (Signed)
ROB doing well. Anesthesia consult completed.  Discussed induction on 07/11/19 @ 0000, reviewed covid testing . GBS & cultures today. Will follow up with result ROB 1 wk   NST INTERPRETATION: Reactive, category 1   Indications: Elevated BMI  Baseline:145  Variability: Moderate Accelerations: present Decelerations: Absent Toco: no ctx   Impression: reactive   Plan: Continue PNC as scheduled     Doreene Burke, [name]

## 2019-06-19 NOTE — Progress Notes (Addendum)
NONSTRESS TEST INTERPRETATION  INDICATIONS: BMI  FHR baseline: 145 RESULTS:reactive COMMENTS: folow up 1  wk   PLAN: 1. Continue fetal kick counts twice a day. 2. Continue antepartum testing as scheduled-Biweekly   Yevonne Aline, LPN

## 2019-06-21 LAB — GC/CHLAMYDIA PROBE AMP
Chlamydia trachomatis, NAA: NEGATIVE
Neisseria Gonorrhoeae by PCR: NEGATIVE

## 2019-06-23 LAB — STREP GP B CULTURE+RFLX: Strep Gp B Culture+Rflx: NEGATIVE

## 2019-06-26 ENCOUNTER — Encounter: Payer: BC Managed Care – PPO | Admitting: Certified Nurse Midwife

## 2019-06-26 ENCOUNTER — Encounter: Payer: Self-pay | Admitting: Certified Nurse Midwife

## 2019-06-26 ENCOUNTER — Other Ambulatory Visit: Payer: Self-pay

## 2019-06-26 ENCOUNTER — Ambulatory Visit (INDEPENDENT_AMBULATORY_CARE_PROVIDER_SITE_OTHER): Payer: BC Managed Care – PPO | Admitting: Certified Nurse Midwife

## 2019-06-26 DIAGNOSIS — Z3483 Encounter for supervision of other normal pregnancy, third trimester: Secondary | ICD-10-CM | POA: Diagnosis not present

## 2019-06-26 NOTE — Patient Instructions (Signed)
Braxton Hicks Contractions °Contractions of the uterus can occur throughout pregnancy, but they are not always a sign that you are in labor. You may have practice contractions called Braxton Hicks contractions. These false labor contractions are sometimes confused with true labor. °What are Braxton Hicks contractions? °Braxton Hicks contractions are tightening movements that occur in the muscles of the uterus before labor. Unlike true labor contractions, these contractions do not result in opening (dilation) and thinning of the cervix. Toward the end of pregnancy (32-34 weeks), Braxton Hicks contractions can happen more often and may become stronger. These contractions are sometimes difficult to tell apart from true labor because they can be very uncomfortable. You should not feel embarrassed if you go to the hospital with false labor. °Sometimes, the only way to tell if you are in true labor is for your health care provider to look for changes in the cervix. The health care provider will do a physical exam and may monitor your contractions. If you are not in true labor, the exam should show that your cervix is not dilating and your water has not broken. °If there are no other health problems associated with your pregnancy, it is completely safe for you to be sent home with false labor. You may continue to have Braxton Hicks contractions until you go into true labor. °How to tell the difference between true labor and false labor °True labor °· Contractions last 30-70 seconds. °· Contractions become very regular. °· Discomfort is usually felt in the top of the uterus, and it spreads to the lower abdomen and low back. °· Contractions do not go away with walking. °· Contractions usually become more intense and increase in frequency. °· The cervix dilates and gets thinner. °False labor °· Contractions are usually shorter and not as strong as true labor contractions. °· Contractions are usually irregular. °· Contractions  are often felt in the front of the lower abdomen and in the groin. °· Contractions may go away when you walk around or change positions while lying down. °· Contractions get weaker and are shorter-lasting as time goes on. °· The cervix usually does not dilate or become thin. °Follow these instructions at home: ° °· Take over-the-counter and prescription medicines only as told by your health care provider. °· Keep up with your usual exercises and follow other instructions from your health care provider. °· Eat and drink lightly if you think you are going into labor. °· If Braxton Hicks contractions are making you uncomfortable: °? Change your position from lying down or resting to walking, or change from walking to resting. °? Sit and rest in a tub of warm water. °? Drink enough fluid to keep your urine pale yellow. Dehydration may cause these contractions. °? Do slow and deep breathing several times an hour. °· Keep all follow-up prenatal visits as told by your health care provider. This is important. °Contact a health care provider if: °· You have a fever. °· You have continuous pain in your abdomen. °Get help right away if: °· Your contractions become stronger, more regular, and closer together. °· You have fluid leaking or gushing from your vagina. °· You pass blood-tinged mucus (bloody show). °· You have bleeding from your vagina. °· You have low back pain that you never had before. °· You feel your baby’s head pushing down and causing pelvic pressure. °· Your baby is not moving inside you as much as it used to. °Summary °· Contractions that occur before labor are   called Braxton Hicks contractions, false labor, or practice contractions. °· Braxton Hicks contractions are usually shorter, weaker, farther apart, and less regular than true labor contractions. True labor contractions usually become progressively stronger and regular, and they become more frequent. °· Manage discomfort from Braxton Hicks contractions  by changing position, resting in a warm bath, drinking plenty of water, or practicing deep breathing. °This information is not intended to replace advice given to you by your health care provider. Make sure you discuss any questions you have with your health care provider. °Document Revised: 04/29/2017 Document Reviewed: 09/30/2016 °Elsevier Patient Education © 2020 Elsevier Inc. ° °

## 2019-06-26 NOTE — Progress Notes (Signed)
Virtual Visit via Telephone Note  I connected with Cheryl Mills on 06/26/19 at  9:30 AM EST by telephone and verified that I am speaking with the correct person using two identifiers.   I discussed the limitations, risks, security and privacy concerns of performing an evaluation and management service by telephone and the availability of in person appointments. I also discussed with the patient that there may be a patient responsible charge related to this service. The patient expressed understanding and agreed to proceed.   History of Present Illness: G3P1011 37.[redacted] wks pregnant covid positive , self quarantine until 07/03/19. Prenatal check in    Observations/Objective: Pt doing well, lost mucus plug, denies reqular contractions, feels fetal movement  Assessment and Plan: Kick counts reviewed. Pt instructed to go to hospital with any concerns until self quarantine completed. Red flag symptoms reviewed.   Follow Up Instructions: In the office 2/2 for NST and ROB.    I discussed the assessment and treatment plan with the patient. The patient was provided an opportunity to ask questions and all were answered. The patient agreed with the plan and demonstrated an understanding of the instructions.   The patient was advised to call back or seek an in-person evaluation if the symptoms worsen or if the condition fails to improve as anticipated.  I provided 10  minutes of non-face-to-face time during this encounter.   Doreene Burke, [name]

## 2019-07-03 ENCOUNTER — Other Ambulatory Visit: Payer: Self-pay

## 2019-07-03 ENCOUNTER — Other Ambulatory Visit: Payer: BC Managed Care – PPO

## 2019-07-03 ENCOUNTER — Encounter: Payer: BC Managed Care – PPO | Admitting: Certified Nurse Midwife

## 2019-07-03 ENCOUNTER — Encounter: Payer: Self-pay | Admitting: Certified Nurse Midwife

## 2019-07-03 ENCOUNTER — Ambulatory Visit (INDEPENDENT_AMBULATORY_CARE_PROVIDER_SITE_OTHER): Payer: BC Managed Care – PPO | Admitting: Certified Nurse Midwife

## 2019-07-03 VITALS — BP 110/68 | HR 82 | Wt 293.4 lb

## 2019-07-03 DIAGNOSIS — Z3483 Encounter for supervision of other normal pregnancy, third trimester: Secondary | ICD-10-CM

## 2019-07-03 DIAGNOSIS — Z3A38 38 weeks gestation of pregnancy: Secondary | ICD-10-CM | POA: Diagnosis not present

## 2019-07-03 DIAGNOSIS — O99212 Obesity complicating pregnancy, second trimester: Secondary | ICD-10-CM | POA: Diagnosis not present

## 2019-07-03 LAB — POCT URINALYSIS DIPSTICK OB
Bilirubin, UA: NEGATIVE
Blood, UA: NEGATIVE
Glucose, UA: NEGATIVE
Ketones, UA: NEGATIVE
Leukocytes, UA: NEGATIVE
Nitrite, UA: NEGATIVE
POC,PROTEIN,UA: NEGATIVE
Spec Grav, UA: 1.01 (ref 1.010–1.025)
Urobilinogen, UA: 0.2 E.U./dL
pH, UA: 5 (ref 5.0–8.0)

## 2019-07-03 NOTE — Progress Notes (Signed)
Body mass index is 48.82 kg/m. ROB/NST for BMI >40  NST INTERPRETATION: Reactive   Indications: elevated BMI in pregnancy Baseline: 145 Variability: moderate Accelerations: present Decelerations: absent Toco: irriatbility   Impression: reactive   Plan: Follow up as scheduled for u/s , then Induction on 07/11/19 @[facility]     , [name]

## 2019-07-03 NOTE — Patient Instructions (Signed)

## 2019-07-10 ENCOUNTER — Other Ambulatory Visit: Payer: Self-pay

## 2019-07-10 ENCOUNTER — Encounter: Payer: Self-pay | Admitting: Certified Nurse Midwife

## 2019-07-10 ENCOUNTER — Ambulatory Visit (INDEPENDENT_AMBULATORY_CARE_PROVIDER_SITE_OTHER): Payer: BC Managed Care – PPO | Admitting: Certified Nurse Midwife

## 2019-07-10 ENCOUNTER — Other Ambulatory Visit: Payer: BC Managed Care – PPO

## 2019-07-10 VITALS — BP 105/72 | HR 90 | Wt 298.3 lb

## 2019-07-10 DIAGNOSIS — Z3483 Encounter for supervision of other normal pregnancy, third trimester: Secondary | ICD-10-CM

## 2019-07-10 LAB — POCT URINALYSIS DIPSTICK OB
Bilirubin, UA: NEGATIVE
Blood, UA: NEGATIVE
Glucose, UA: NEGATIVE
Ketones, UA: NEGATIVE
Leukocytes, UA: NEGATIVE
Nitrite, UA: NEGATIVE
POC,PROTEIN,UA: NEGATIVE
Spec Grav, UA: >=1.03 — AB (ref 1.010–1.025)
Urobilinogen, UA: 0.2 E.U./dL
pH, UA: 5 (ref 5.0–8.0)

## 2019-07-10 MED ORDER — TETANUS-DIPHTH-ACELL PERTUSSIS 5-2.5-18.5 LF-MCG/0.5 IM SUSP: 0.5000 mL | Freq: Once | INTRAMUSCULAR | Status: DC

## 2019-07-10 MED ORDER — RHO D IMMUNE GLOBULIN 1500 UNITS IM SOSY: 1500.0000 [IU] | PREFILLED_SYRINGE | Freq: Once | INTRAMUSCULAR | Status: DC

## 2019-07-10 NOTE — Addendum Note (Signed)
Addended by: Brooke Dare on: 07/10/2019 11:06 AM   Modules accepted: Orders

## 2019-07-10 NOTE — Addendum Note (Signed)
Addended by: Brooke Dare on: 07/10/2019 10:04 AM   Modules accepted: Orders

## 2019-07-10 NOTE — Addendum Note (Signed)
Addended by: Brooke Dare on: 07/10/2019 10:09 AM   Modules accepted: Orders

## 2019-07-10 NOTE — Patient Instructions (Signed)

## 2019-07-10 NOTE — Progress Notes (Signed)
ROB doing well. Has induction scheduled for tonight at midnight. Reviewed instructions. Follow up as scheduled.   Doreene Burke, [name]

## 2019-07-11 ENCOUNTER — Inpatient Hospital Stay: Payer: BC Managed Care – PPO | Admitting: Anesthesiology

## 2019-07-11 ENCOUNTER — Encounter: Payer: Self-pay | Admitting: Obstetrics and Gynecology

## 2019-07-11 ENCOUNTER — Inpatient Hospital Stay
Admission: RE | Admit: 2019-07-11 | Discharge: 2019-07-12 | DRG: 807 | Disposition: A | Discharge status: Home / Self Care | Payer: BC Managed Care – PPO | Attending: Certified Nurse Midwife | Admitting: Certified Nurse Midwife

## 2019-07-11 DIAGNOSIS — Z3A39 39 weeks gestation of pregnancy: Secondary | ICD-10-CM

## 2019-07-11 DIAGNOSIS — Z6791 Unspecified blood type, Rh negative: Secondary | ICD-10-CM

## 2019-07-11 DIAGNOSIS — Z8616 Personal history of COVID-19: Secondary | ICD-10-CM | POA: Diagnosis not present

## 2019-07-11 DIAGNOSIS — O99214 Obesity complicating childbirth: Principal | ICD-10-CM | POA: Diagnosis present

## 2019-07-11 DIAGNOSIS — Z87891 Personal history of nicotine dependence: Secondary | ICD-10-CM

## 2019-07-11 DIAGNOSIS — O26893 Other specified pregnancy related conditions, third trimester: Secondary | ICD-10-CM | POA: Diagnosis present

## 2019-07-11 LAB — CBC
HCT: 33.1 % — ABNORMAL LOW (ref 36.0–46.0)
Hemoglobin: 10.9 g/dL — ABNORMAL LOW (ref 12.0–15.0)
MCH: 26.5 pg (ref 26.0–34.0)
MCHC: 32.9 g/dL (ref 30.0–36.0)
MCV: 80.3 fL (ref 80.0–100.0)
Platelets: 276 10*3/uL (ref 150–400)
RBC: 4.12 MIL/uL (ref 3.87–5.11)
RDW: 15.7 % — ABNORMAL HIGH (ref 11.5–15.5)
WBC: 11.6 10*3/uL — ABNORMAL HIGH (ref 4.0–10.5)
nRBC: 0 % (ref 0.0–0.2)

## 2019-07-11 LAB — RPR: RPR Ser Ql: NONREACTIVE

## 2019-07-11 LAB — RAPID HIV SCREEN (HIV 1/2 AB+AG)
HIV 1/2 Antibodies: NONREACTIVE
HIV-1 P24 Antigen - HIV24: NONREACTIVE

## 2019-07-11 LAB — TYPE AND SCREEN
ABO/RH(D): A NEG
Antibody Screen: NEGATIVE

## 2019-07-11 MED ORDER — IBUPROFEN 600 MG PO TABS
600.0000 mg | ORAL_TABLET | Freq: Four times a day (QID) | ORAL | Status: DC
  Administered 2019-07-12 (×3): 600 mg via ORAL
  Filled 2019-07-11 (×3): qty 1

## 2019-07-11 MED ORDER — AMMONIA AROMATIC IN INHA
RESPIRATORY_TRACT | Status: AC
  Filled 2019-07-11: qty 10

## 2019-07-11 MED ORDER — DOCUSATE SODIUM 100 MG PO CAPS
100.0000 mg | ORAL_CAPSULE | Freq: Two times a day (BID) | ORAL | Status: DC
  Administered 2019-07-11 – 2019-07-12 (×2): 100 mg via ORAL
  Filled 2019-07-11 (×2): qty 1

## 2019-07-11 MED ORDER — ACETAMINOPHEN 325 MG PO TABS: 650.0000 mg | ORAL_TABLET | ORAL | Status: DC | PRN

## 2019-07-11 MED ORDER — DIPHENHYDRAMINE HCL 50 MG/ML IJ SOLN: 12.5000 mg | INTRAMUSCULAR | Status: DC | PRN

## 2019-07-11 MED ORDER — OXYCODONE-ACETAMINOPHEN 5-325 MG PO TABS: 1.0000 | ORAL_TABLET | ORAL | Status: DC | PRN

## 2019-07-11 MED ORDER — PHENYLEPHRINE 40 MCG/ML (10ML) SYRINGE FOR IV PUSH (FOR BLOOD PRESSURE SUPPORT)
80.0000 ug | PREFILLED_SYRINGE | INTRAVENOUS | Status: DC | PRN
  Filled 2019-07-11: qty 10

## 2019-07-11 MED ORDER — EPHEDRINE 5 MG/ML INJ
10.0000 mg | INTRAVENOUS | Status: DC | PRN
  Filled 2019-07-11: qty 2

## 2019-07-11 MED ORDER — LIDOCAINE HCL (PF) 1 % IJ SOLN
INTRAMUSCULAR | Status: DC | PRN
  Administered 2019-07-11: 3 mL

## 2019-07-11 MED ORDER — FENTANYL 2.5 MCG/ML W/ROPIVACAINE 0.15% IN NS 100 ML EPIDURAL (ARMC)
EPIDURAL | Status: AC
  Filled 2019-07-11: qty 100

## 2019-07-11 MED ORDER — OXYTOCIN 40 UNITS IN NORMAL SALINE INFUSION - SIMPLE MED: 1.0000 m[IU]/min | INTRAVENOUS | Status: DC

## 2019-07-11 MED ORDER — LIDOCAINE-EPINEPHRINE (PF) 1.5 %-1:200000 IJ SOLN
INTRAMUSCULAR | Status: DC | PRN
  Administered 2019-07-11: 2 mL via PERINEURAL
  Administered 2019-07-11: 3 mL via PERINEURAL

## 2019-07-11 MED ORDER — WITCH HAZEL-GLYCERIN EX PADS: 1.0000 "application " | MEDICATED_PAD | CUTANEOUS | Status: DC | PRN

## 2019-07-11 MED ORDER — ONDANSETRON HCL 4 MG/2ML IJ SOLN: 4.0000 mg | INTRAMUSCULAR | Status: DC | PRN

## 2019-07-11 MED ORDER — COCONUT OIL OIL
1.0000 "application " | TOPICAL_OIL | Status: DC | PRN
  Filled 2019-07-11: qty 120

## 2019-07-11 MED ORDER — TERBUTALINE SULFATE 1 MG/ML IJ SOLN: 0.2500 mg | Freq: Once | INTRAMUSCULAR | Status: DC | PRN

## 2019-07-11 MED ORDER — ONDANSETRON HCL 4 MG/2ML IJ SOLN: 4.0000 mg | Freq: Four times a day (QID) | INTRAMUSCULAR | Status: DC | PRN

## 2019-07-11 MED ORDER — OXYTOCIN 40 UNITS IN NORMAL SALINE INFUSION - SIMPLE MED
2.5000 [IU]/h | INTRAVENOUS | Status: DC
  Filled 2019-07-11: qty 1000

## 2019-07-11 MED ORDER — SENNOSIDES-DOCUSATE SODIUM 8.6-50 MG PO TABS: 2.0000 | ORAL_TABLET | ORAL | Status: DC

## 2019-07-11 MED ORDER — MISOPROSTOL 50MCG HALF TABLET
ORAL_TABLET | ORAL | Status: AC
  Filled 2019-07-11: qty 1

## 2019-07-11 MED ORDER — METHYLERGONOVINE MALEATE 0.2 MG/ML IJ SOLN: 0.2000 mg | INTRAMUSCULAR | Status: DC | PRN

## 2019-07-11 MED ORDER — LACTATED RINGERS IV SOLN
500.0000 mL | Freq: Once | INTRAVENOUS | Status: AC
  Administered 2019-07-11: 500 mL via INTRAVENOUS

## 2019-07-11 MED ORDER — SENNOSIDES-DOCUSATE SODIUM 8.6-50 MG PO TABS
2.0000 | ORAL_TABLET | ORAL | Status: DC
  Administered 2019-07-12: 10:00:00 2 via ORAL
  Filled 2019-07-11: qty 2

## 2019-07-11 MED ORDER — LIDOCAINE HCL (PF) 1 % IJ SOLN
INTRAMUSCULAR | Status: AC
  Filled 2019-07-11: qty 30

## 2019-07-11 MED ORDER — LACTATED RINGERS IV SOLN: 500.0000 mL | INTRAVENOUS | Status: DC | PRN

## 2019-07-11 MED ORDER — BUPIVACAINE HCL (PF) 0.25 % IJ SOLN
INTRAMUSCULAR | Status: DC | PRN
  Administered 2019-07-11: 4 mL via EPIDURAL

## 2019-07-11 MED ORDER — PRENATAL MULTIVITAMIN CH
1.0000 | ORAL_TABLET | Freq: Every day | ORAL | Status: DC
  Administered 2019-07-11 – 2019-07-12 (×2): 1 via ORAL
  Filled 2019-07-11 (×2): qty 1

## 2019-07-11 MED ORDER — BUTORPHANOL TARTRATE 1 MG/ML IJ SOLN
1.0000 mg | INTRAMUSCULAR | Status: DC | PRN
  Administered 2019-07-11 (×3): 1 mg via INTRAVENOUS
  Filled 2019-07-11 (×3): qty 1

## 2019-07-11 MED ORDER — OXYTOCIN 10 UNIT/ML IJ SOLN
INTRAMUSCULAR | Status: AC
  Filled 2019-07-11: qty 2

## 2019-07-11 MED ORDER — IBUPROFEN 600 MG PO TABS
600.0000 mg | ORAL_TABLET | Freq: Four times a day (QID) | ORAL | Status: DC
  Administered 2019-07-11 (×2): 600 mg via ORAL
  Filled 2019-07-11 (×2): qty 1

## 2019-07-11 MED ORDER — LIDOCAINE HCL (PF) 1 % IJ SOLN: 30.0000 mL | INTRAMUSCULAR | Status: DC | PRN

## 2019-07-11 MED ORDER — SIMETHICONE 80 MG PO CHEW: 80.0000 mg | CHEWABLE_TABLET | ORAL | Status: DC | PRN

## 2019-07-11 MED ORDER — OXYTOCIN 40 UNITS IN NORMAL SALINE INFUSION - SIMPLE MED
INTRAVENOUS | Status: AC
  Administered 2019-07-11: 09:00:00 4 m[IU]/min via INTRAVENOUS
  Filled 2019-07-11: qty 1000

## 2019-07-11 MED ORDER — OXYTOCIN BOLUS FROM INFUSION
500.0000 mL | Freq: Once | INTRAVENOUS | Status: AC
  Administered 2019-07-11: 10:00:00 500 mL via INTRAVENOUS

## 2019-07-11 MED ORDER — DIBUCAINE (PERIANAL) 1 % EX OINT: 1.0000 "application " | TOPICAL_OINTMENT | CUTANEOUS | Status: DC | PRN

## 2019-07-11 MED ORDER — ONDANSETRON HCL 4 MG PO TABS: 4.0000 mg | ORAL_TABLET | ORAL | Status: DC | PRN

## 2019-07-11 MED ORDER — BENZOCAINE-MENTHOL 20-0.5 % EX AERO: 1.0000 "application " | INHALATION_SPRAY | CUTANEOUS | Status: DC | PRN

## 2019-07-11 MED ORDER — MISOPROSTOL 200 MCG PO TABS
ORAL_TABLET | ORAL | Status: AC
  Administered 2019-07-11: 50 ug via VAGINAL
  Filled 2019-07-11: qty 4

## 2019-07-11 MED ORDER — FENTANYL 2.5 MCG/ML W/ROPIVACAINE 0.15% IN NS 100 ML EPIDURAL (ARMC)
12.0000 mL/h | EPIDURAL | Status: DC
  Administered 2019-07-11: 09:00:00 12 mL/h via EPIDURAL

## 2019-07-11 MED ORDER — MISOPROSTOL 100 MCG PO TABS
50.0000 ug | ORAL_TABLET | ORAL | Status: DC | PRN
  Filled 2019-07-11: qty 1

## 2019-07-11 MED ORDER — OXYCODONE-ACETAMINOPHEN 5-325 MG PO TABS: 2.0000 | ORAL_TABLET | ORAL | Status: DC | PRN

## 2019-07-11 MED ORDER — LACTATED RINGERS IV SOLN: INTRAVENOUS | Status: DC

## 2019-07-11 MED ORDER — FERROUS SULFATE 325 (65 FE) MG PO TABS
325.0000 mg | ORAL_TABLET | Freq: Every day | ORAL | Status: DC
  Administered 2019-07-12: 325 mg via ORAL
  Filled 2019-07-11: qty 1

## 2019-07-11 MED ORDER — METHYLERGONOVINE MALEATE 0.2 MG PO TABS
0.2000 mg | ORAL_TABLET | ORAL | Status: DC | PRN
  Filled 2019-07-11: qty 1

## 2019-07-11 MED ORDER — SOD CITRATE-CITRIC ACID 500-334 MG/5ML PO SOLN: 30.0000 mL | ORAL | Status: DC | PRN

## 2019-07-11 NOTE — Anesthesia Procedure Notes (Signed)
Epidural  Start time: 07/11/2019 8:18 AM End time: 07/11/2019 8:33 AM  Staffing Anesthesiologist: Piscitello, Cleda Mccreedy, MD Resident/CRNA: Elmarie Mainland, CRNA Performed: resident/CRNA   Preanesthetic Checklist Completed: patient identified, IV checked, site marked, risks and benefits discussed, surgical consent, monitors and equipment checked, pre-op evaluation and timeout performed  Epidural Patient position: sitting Prep: ChloraPrep Patient monitoring: continuous pulse ox and blood pressure Approach: midline Location: L3-L4 Injection technique: LOR saline  Needle:  Needle type: Tuohy  Needle gauge: 17 G Needle length: 9 cm Needle insertion depth: 8 cm Catheter size: 19 Gauge Catheter at skin depth: 13 cm Test dose: 1.5% lidocaine with Epi 1:200 K  Assessment Events: blood not aspirated, injection not painful, no injection resistance, no paresthesia and negative IV test  Additional Notes Reason for block:surgical anesthesia

## 2019-07-11 NOTE — Lactation Note (Signed)
This note was copied from a baby's chart. Lactation Consultation Note  Patient Name: Cheryl Mills [id number] Date: 07/11/2019 Reason for consult: Initial assessment  LC and [facility] student entered room for initial assessment, per request from transition RN. Upon entering, both mother of baby and support person were awake, with baby awake and not showing any signs of hunger cues.   Mother of baby had a spontaneous vaginal delivery with infant born at [redacted]w[redacted]d GA. Pecola Leisure is currently 2 hours old, weighing 3860g. Mother of baby has breastfeeding experience of 1 month. Mother of baby reports that she stopped breastfeeding due to mastitis and supply issues.   [facility] student educated mother of baby on breastfeeding basics and the importance of stimulation in the early days to create a good milk supply. LC went over the importance of having a deep latch at every feed and watching for early hunger cues.   [facility] student encouraged mother of baby to feed on demand and bring baby skin to skin as much as possible. Mother of baby encouraged to follow hunger cues. LC encouraged mother of baby to call out for any questions for continued lactation support. LC to perform follow up assessment.   Maternal Data Has patient been taught Hand Expression?: No Does the patient have breastfeeding experience prior to this delivery?: Yes  Feeding Feeding Type: Breast Fed  LATCH Score                   Interventions Interventions: Breast feeding basics reviewed  Lactation Tools Discussed/Used     Consult Status Consult Status: Follow-up Date: 07/11/19 Follow-up type: In-patient    Jimmye Norman 07/11/2019, 12:21 PM

## 2019-07-11 NOTE — Lactation Note (Signed)
This note was copied from a baby's chart. Lactation Consultation Note  Patient Name: Cheryl Mills [id number] Date: 07/11/2019 Reason for consult: Initial assessment  LC student entered room for follow up, from the initial assessment. Mother of baby was sitting up in the bed, with baby asleep on her chest, skin to skin, not exhibiting any hunger cues. Support person was present and in the bedside chair.   Mother of baby reports that she did have a history of mastitis with her first child, only in one breast though. Mother of baby states that she did face supply issues, and that ultimately was the reason that she stopped breastfeeding.   [facility] student encouraged mother of baby to continue skin to skin as much as possible. [facility] student reiterated the importance of following hunger cues and reviewed the breastfeeding basics. [facility] student reminded mother of baby the importance of feeding on demand and nursing baby 8-12x/24hr period. Mother of baby understood. Mother of baby did inquire about positioning options for baby, so that she could be more comfortable with nursing baby on the right side. [facility] student demonstrated cross cradle position and encouraged mother of baby to try that position on the right breast.   Mother of baby understood. [facility] student encouraged skin to skin, breastfeeding basics, and reviewed hand expression. Mother of baby encouraged to call out to in-patient lactation for continued support, as needed. LC to follow up on 07/12/2019.   Maternal Data Formula Feeding for Exclusion: No Has patient been taught Hand Expression?: Yes Does the patient have breastfeeding experience prior to this delivery?: Yes  Feeding Feeding Type: Breast Fed  LATCH Score                   Interventions Interventions: Breast feeding basics reviewed  Lactation Tools Discussed/Used     Consult Status Consult Status: Follow-up Date: 07/12/19 Follow-up type: In-patient    Jimmye Norman 07/11/2019, 4:41 PM

## 2019-07-11 NOTE — Anesthesia Preprocedure Evaluation (Signed)
Anesthesia Evaluation  Patient identified by MRN, date of birth, ID band Patient awake    Reviewed: Allergy & Precautions, H&P , NPO status , Patient's Chart, lab work & pertinent test results  Airway Mallampati: II  TM Distance: >3 FB Neck ROM: full    Dental no notable dental hx.    Pulmonary former smoker,           Cardiovascular negative cardio ROS       Neuro/Psych negative neurological ROS  negative psych ROS   GI/Hepatic Neg liver ROS, GERD  ,  Endo/Other  negative endocrine ROS  Renal/GU negative Renal ROS  negative genitourinary   Musculoskeletal   Abdominal   Peds  Hematology negative hematology ROS (+)   Anesthesia Other Findings   Reproductive/Obstetrics (+) Pregnancy                             Anesthesia Physical Anesthesia Plan  ASA: II  Anesthesia Plan: Epidural   Post-op Pain Management:    Induction:   PONV Risk Score and Plan:   Airway Management Planned:   Additional Equipment:   Intra-op Plan:   Post-operative Plan:   Informed Consent: I have reviewed the patients History and Physical, chart, labs and discussed the procedure including the risks, benefits and alternatives for the proposed anesthesia with the patient or authorized representative who has indicated his/her understanding and acceptance.       Plan Discussed with: Anesthesiologist and CRNA  Anesthesia Plan Comments:         Anesthesia Quick Evaluation

## 2019-07-11 NOTE — H&P (Signed)
History and Physical   HPI  Cheryl Mills is a 24 y.o. G3P1011 at [redacted]w[redacted]d Estimated Date of Delivery: 07/14/19 who is being admitted for induction of labor due to elevated BMI in pregnancy.    OB History  OB History  Gravida Para Term Preterm AB Living  3 1 1  0 1 1  SAB TAB Ectopic Multiple Live Births  1 0 0 0 1    # Outcome Date GA Lbr Len/2nd Weight Sex Delivery Anes PTL Lv  3 Current           2 SAB 2020 [redacted]w[redacted]d         1 Term 10/11/17 [redacted]w[redacted]d 07:13 / 00:53 3670 g F Vag-Spont EPI  LIV     Name: Caul,GIRL Jaquilla     Apgar1: 8  Apgar5: 9    PROBLEM LIST  Pregnancy complications or risks: Patient Active Problem List   Diagnosis Date Noted  . Labor and delivery, indication for care 07/11/2019  . Obesity affecting pregnancy 01/22/2019  . BMI 40.0-44.9, adult ([facility]) 03/30/2017  . Rh negative state in antepartum period 03/09/2017    Prenatal labs and studies: ABO, Rh: --/--/A NEG (02/10 0040) Antibody: NEG (02/10 0040) Rubella: 1.45 (08/24 1545) RPR: Non Reactive (12/01 1001)  HBsAg: Negative (08/24 1545)  HIV: NON REACTIVE (02/10 0040)  JQG:BEEFEOFH/-- (01/19 1348)   Past Medical History:  Diagnosis Date  . Herpes, genital    age 71y first and only breakfast  . Missed menses   . PCOS (polycystic ovarian syndrome)      Past Surgical History:  Procedure Laterality Date  . TYMPANOSTOMY TUBE PLACEMENT       Medications    Current Discharge Medication List    CONTINUE these medications which have NOT CHANGED   Details  aspirin 81 MG EC tablet Take 1 tablet (81 mg total) by mouth daily. Swallow whole. Qty: 30 tablet, Refills: 12    Prenatal 28-0.8 MG TABS Take by mouth.         Allergies  Amoxil [amoxicillin]  Review of Systems  Constitutional: negative Eyes: negative Ears, nose, mouth, throat, and face: negative Respiratory: negative Cardiovascular: negative Gastrointestinal: negative Genitourinary:negative Integument/breast:  negative Hematologic/lymphatic: negative Musculoskeletal:negative Neurological: negative Behavioral/Psych: negative Endocrine: negative Allergic/Immunologic: negative  Physical Exam  BP 126/77 (BP Location: Left Arm)   Pulse 79   Temp 97.8 F (36.6 C) (Oral)   Resp 16   Ht 5\' 6"  (1.676 m)   Wt 135.2 kg   BMI 48.10 kg/m   Lungs:  CTA B Cardio: RRR Abd: Soft, gravid, NT Presentation: cephalic EXT: No C/C/ 1+ Edema DTRs: 2+ B CERVIX: Dilation: 5 Effacement (%): 60, 70 Cervical Position: Middle Station: -2 Presentation: Vertex Exam by:: A Dammon Makarewicz  See Prenatal records for more detailed PE.     FHR:  Baseline: 145 bpm, Variability: Good {> 6 bpm), Accelerations: Reactive and Decelerations: Absent  Toco: Uterine Contractions: Frequency: Every 1-2 minutes, Duration: 50-70 seconds and Intensity: mild to moderate  Test Results  Results for orders placed or performed during the hospital encounter of 07/11/19 (from the past 24 hour(s))  CBC     Status: Abnormal   Collection Time: 07/11/19 12:40 AM  Result Value Ref Range   WBC 11.6 (H) 4.0 - 10.5 K/uL   RBC 4.12 3.87 - 5.11 MIL/uL   Hemoglobin 10.9 (L) 12.0 - 15.0 g/dL   HCT 33.1 (L) 36.0 - 46.0 %   MCV 80.3 80.0 -  100.0 fL   MCH 26.5 26.0 - 34.0 pg   MCHC 32.9 30.0 - 36.0 g/dL   RDW 09.8 (H) 11.9 - 14.7 %   Platelets 276 150 - 400 K/uL   nRBC 0.0 0.0 - 0.2 %  Type and screen     Status: None   Collection Time: 07/11/19 12:40 AM  Result Value Ref Range   ABO/RH(D) A NEG    Antibody Screen NEG    Sample Expiration      07/14/2019,2359 Performed at [facility], [address] [place], [place] [address]   Rapid HIV screen [facility] L&D dept ONLY)     Status: None   Collection Time: 07/11/19 12:40 AM  Result Value Ref Range   HIV-1 P24 Antigen - HIV24 NON REACTIVE NON REACTIVE   HIV 1/2 Antibodies NON REACTIVE NON REACTIVE   Interpretation (HIV Ag Ab)      A non reactive test result means that HIV 1  or HIV 2 antibodies and HIV 1 p24 antigen were not detected in the specimen.   Group B Strep negative  Covid positive this pregnancy   Assessment   G3P1011 at [redacted]w[redacted]d Estimated Date of Delivery: 07/14/19  The fetus is reassuring.   Patient Active Problem List   Diagnosis Date Noted  . Labor and delivery, indication for care 07/11/2019  . Obesity affecting pregnancy 01/22/2019  . BMI 40.0-44.9, adult ([facility]) 03/30/2017  . Rh negative state in antepartum period 03/09/2017    Plan  1. Admit to L&D :   2. EFM:-- Category 1 3. Stadol or Epidural if desired.   4. Admission labs  5. Anticipate NSVD  Doreene Burke, [name]  07/11/2019 7:52 AM

## 2019-07-12 LAB — CBC
HCT: 29.7 % — ABNORMAL LOW (ref 36.0–46.0)
Hemoglobin: 9.3 g/dL — ABNORMAL LOW (ref 12.0–15.0)
MCH: 26 pg (ref 26.0–34.0)
MCHC: 31.3 g/dL (ref 30.0–36.0)
MCV: 83 fL (ref 80.0–100.0)
Platelets: 213 10*3/uL (ref 150–400)
RBC: 3.58 MIL/uL — ABNORMAL LOW (ref 3.87–5.11)
RDW: 16.4 % — ABNORMAL HIGH (ref 11.5–15.5)
WBC: 9.3 10*3/uL (ref 4.0–10.5)
nRBC: 0 % (ref 0.0–0.2)

## 2019-07-12 NOTE — Progress Notes (Signed)
Patient discharged home with infant. Discharge instructions, prescriptions and follow up appointment given to and reviewed with patient. Patient verbalized understanding. Waiting on ride 

## 2019-07-12 NOTE — Lactation Note (Signed)
This note was copied from a baby's chart. Lactation Consultation Note  Patient Name: Girl Kennadie Brenner [id number] Date: 07/12/2019 Reason for consult: Follow-up assessment  LC into see mom and baby before discharge. Mom reports baby continuing to breastfeed well; cluster fed overnight, and now sleepy. LC gave guidance for adequate milk removal, expectations with breastfeeding in the days/weeks to come, and pumping guidance based on mom's questions. Encouraged to continue putting baby to breast on cue, emphasizing importance of milk removal to establish a plentiful supply as baby grows. Mom has no further questions or concerns at this time. Outpatient lactation information and community breastfeeding support resource sheet provided.  Maternal Data Formula Feeding for Exclusion: No Has patient been taught Hand Expression?: Yes  Feeding Feeding Type: Breast Fed  LATCH Score                   Interventions Interventions: Breast feeding basics reviewed;DEBP  Lactation Tools Discussed/Used     Consult Status Consult Status: Complete Date: 07/12/19 Follow-up type: Call as needed    Danford Bad 07/12/2019, 11:34 AM

## 2019-07-12 NOTE — Progress Notes (Signed)
Wheeled out by NT

## 2019-07-12 NOTE — Anesthesia Postprocedure Evaluation (Signed)
Anesthesia Post Note  Patient: Cheryl Mills  Procedure(s) Performed: AN AD HOC LABOR EPIDURAL  Patient location during evaluation: Mother Baby Anesthesia Type: Epidural Level of consciousness: awake and alert Pain management: pain level controlled Vital Signs Assessment: post-procedure vital signs reviewed and stable Respiratory status: spontaneous breathing, nonlabored ventilation and respiratory function stable Cardiovascular status: stable Postop Assessment: no headache, no backache and epidural receding Anesthetic complications: no     Last Vitals:  Vitals:   07/11/19 2304 07/12/19 0847  BP: 99/64 112/75  Pulse: 87 97  Resp: 20 18  Temp: 36.6 C 36.7 C  SpO2: 100% 100%    Last Pain:  Vitals:   07/12/19 1004  TempSrc:   PainSc: 0-No pain                 Lason Eveland Lawerance Cruel

## 2019-07-17 ENCOUNTER — Encounter: Payer: BC Managed Care – PPO | Admitting: Certified Nurse Midwife

## 2019-07-20 ENCOUNTER — Telehealth: Payer: Self-pay

## 2019-07-20 NOTE — Telephone Encounter (Signed)
-----   Message from [place] sent at 07/19/2019  2:13 PM EST ----- HEY Wynona Canes from [employer] called he is requesting a call back  [phone]  ----- Message ----- From: Darol Destine, CMA Sent: 07/19/2019   1:41 PM EST To: [place]  [employer] is F[employer]- pls send a telephone encounter to whomever completed her FMLA paperwork. Thanks.  CM ----- Message ----- From: Susanne Greenhouse Sent: 07/13/2019   2:45 PM EST To: Darol Destine, CMA  Shari Prows  from [employer]  called and is requesting a call for this pt   [phone]

## 2019-07-20 NOTE — Telephone Encounter (Signed)
LM for Shari Prows to please return my call.

## 2019-08-02 NOTE — Discharge Summary (Signed)
                              Discharge Summary  Date of Admission: 07/11/2019  Date of Discharge: 07/12/2019  Admitting Diagnosis: Induction of labor at [redacted]w[redacted]d  Mode of Delivery: normal spontaneous vaginal delivery                 Discharge Diagnosis: No other diagnosis   Intrapartum Procedures: Atificial rupture of membranes and epidural   Post partum procedures:   Complications: none and second degree perineal laceration                      Discharge Day SOAP Note:  Progress Note - Vaginal Delivery  Cheryl Mills is a 24 y.o. [id number] now PP day 1 s/p Vaginal, Spontaneous . Delivery was complicated by morbid obesity  Subjective  The patient has the following complaints: has no unusual complaints  Pain is controlled with current medications.   Patient is urinating without difficulty.  She is ambulating well.    Objective  Vital signs: BP 112/75 (BP Location: Left Arm)   Pulse 97   Temp 98.1 F (36.7 C) (Oral)   Resp 18   Ht 5\' 6"  (1.676 m)   Wt 135.2 kg   SpO2 100%   Breastfeeding Unknown   BMI 48.10 kg/m   Physical Exam: Gen: NAD Fundus Fundal Tone: Firm  Lochia Amount: Small  Perineum Appearance: Intact     Data Review Labs: CBC Latest Ref Rng & Units 07/12/2019 07/11/2019 05/01/2019  WBC 4.0 - 10.5 K/uL 9.3 11.6(H) 10.8  Hemoglobin 12.0 - 15.0 g/dL 14/05/2018) 10.9(L) 11.4  Hematocrit 36.0 - 46.0 % 29.7(L) 33.1(L) 33.9(L)  Platelets 150 - 400 K/uL 213 276 256   A NEG  Assessment/Plan  Active Problems:   Labor and delivery, indication for care    Plan for discharge today.   Discharge Instructions: Per After Visit Summary. Activity: Advance as tolerated. Pelvic rest for 6 weeks.  Also refer to After Visit Summary Diet: Regular Medications: Allergies as of 07/12/2019      Reactions   Amoxil [amoxicillin] Rash      Medication List    STOP taking these medications   aspirin 81 MG EC tablet     TAKE these medications   Prenatal 28-0.8 MG  Tabs Take by mouth.      Outpatient follow up:  Follow-up Information    09/09/2019, [name] Follow up in 6 week(s).   Specialties: Certified Nurse Midwife, Radiology Why: please call and schedule a follow up appointment for 1 week for a depo shot and 6 weeks for a postpartum check Contact information: [address] [place] [phone]          Postpartum contraception: Will discuss at first office visit post-partum  Discharged Condition: good  Discharged to: home  Newborn Data: Disposition:home with mother  Apgars: APGAR (1 MIN): 9   APGAR (5 MINS): 9      [phone], M.D. 08/02/2019 8:34 AM

## 2019-08-20 ENCOUNTER — Ambulatory Visit (INDEPENDENT_AMBULATORY_CARE_PROVIDER_SITE_OTHER): Payer: BC Managed Care – PPO | Admitting: Certified Nurse Midwife

## 2019-08-20 ENCOUNTER — Other Ambulatory Visit: Payer: Self-pay

## 2019-08-20 ENCOUNTER — Encounter: Payer: Self-pay | Admitting: Certified Nurse Midwife

## 2019-08-20 MED ORDER — MEDROXYPROGESTERONE ACETATE 150 MG/ML IM SUSP: 150.0000 mg | Freq: Once | INTRAMUSCULAR | 3 refills | Status: DC

## 2019-08-20 NOTE — Patient Instructions (Signed)
Preventive Care 21-26 Years Old, Female Preventive care refers to visits with your health care provider and lifestyle choices that can promote health and wellness. This includes:  A yearly physical exam. This may also be called an annual well check.  Regular dental visits and eye exams.  Immunizations.  Screening for certain conditions.  Healthy lifestyle choices, such as eating a healthy diet, getting regular exercise, not using drugs or products that contain nicotine and tobacco, and limiting alcohol use. What can I expect for my preventive care visit? Physical exam Your health care provider will check your:  Height and weight. This may be used to calculate body mass index (BMI), which tells if you are at a healthy weight.  Heart rate and blood pressure.  Skin for abnormal spots. Counseling Your health care provider may ask you questions about your:  Alcohol, tobacco, and drug use.  Emotional well-being.  Home and relationship well-being.  Sexual activity.  Eating habits.  Work and work environment.  Method of birth control.  Menstrual cycle.  Pregnancy history. What immunizations do I need?  Influenza (flu) vaccine  This is recommended every year. Tetanus, diphtheria, and pertussis (Tdap) vaccine  You may need a Td booster every 10 years. Varicella (chickenpox) vaccine  You may need this if you have not been vaccinated. Human papillomavirus (HPV) vaccine  If recommended by your health care provider, you may need three doses over 6 months. Measles, mumps, and rubella (MMR) vaccine  You may need at least one dose of MMR. You may also need a second dose. Meningococcal conjugate (MenACWY) vaccine  One dose is recommended if you are age 19-21 years and a first-year college student living in a residence hall, or if you have one of several medical conditions. You may also need additional booster doses. Pneumococcal conjugate (PCV13) vaccine  You may need  this if you have certain conditions and were not previously vaccinated. Pneumococcal polysaccharide (PPSV23) vaccine  You may need one or two doses if you smoke cigarettes or if you have certain conditions. Hepatitis A vaccine  You may need this if you have certain conditions or if you travel or work in places where you may be exposed to hepatitis A. Hepatitis B vaccine  You may need this if you have certain conditions or if you travel or work in places where you may be exposed to hepatitis B. Haemophilus influenzae type b (Hib) vaccine  You may need this if you have certain conditions. You may receive vaccines as individual doses or as more than one vaccine together in one shot (combination vaccines). Talk with your health care provider about the risks and benefits of combination vaccines. What tests do I need?  Blood tests  Lipid and cholesterol levels. These may be checked every 5 years starting at age 20.  Hepatitis C test.  Hepatitis B test. Screening  Diabetes screening. This is done by checking your blood sugar (glucose) after you have not eaten for a while (fasting).  Sexually transmitted disease (STD) testing.  BRCA-related cancer screening. This may be done if you have a family history of breast, ovarian, tubal, or peritoneal cancers.  Pelvic exam and Pap test. This may be done every 3 years starting at age 21. Starting at age 30, this may be done every 5 years if you have a Pap test in combination with an HPV test. Talk with your health care provider about your test results, treatment options, and if necessary, the need for more tests.   Follow these instructions at home: Eating and drinking   Eat a diet that includes fresh fruits and vegetables, whole grains, lean protein, and low-fat dairy.  Take vitamin and mineral supplements as recommended by your health care provider.  Do not drink alcohol if: ? Your health care provider tells you not to drink. ? You are  pregnant, may be pregnant, or are planning to become pregnant.  If you drink alcohol: ? Limit how much you have to 0-1 drink a day. ? Be aware of how much alcohol is in your drink. In the [place] one drink equals one 12 oz bottle of beer (355 mL), one 5 oz glass of wine (148 mL), or one 1 oz glass of hard liquor (44 mL). Lifestyle  Take daily care of your teeth and gums.  Stay active. Exercise for at least 30 minutes on 5 or more days each week.  Do not use any products that contain nicotine or tobacco, such as cigarettes, e-cigarettes, and chewing tobacco. If you need help quitting, ask your health care provider.  If you are sexually active, practice safe sex. Use a condom or other form of birth control (contraception) in order to prevent pregnancy and STIs (sexually transmitted infections). If you plan to become pregnant, see your health care provider for a preconception visit. What's next?  Visit your health care provider once a year for a well check visit.  Ask your health care provider how often you should have your eyes and teeth checked.  Stay up to date on all vaccines. This information is not intended to replace advice given to you by your health care provider. Make sure you discuss any questions you have with your health care provider. Document Revised: 01/26/2018 Document Reviewed: 01/26/2018 Elsevier Patient Education  2020 [employer].

## 2019-08-20 NOTE — Progress Notes (Signed)
Subjective:    Cheryl Mills is a 24 y.o. G72P2012 Caucasian female who presents for a postpartum visit. She is 6 weeks postpartum following a spontaneous vaginal delivery at 39.4 gestational weeks. Anesthesia: epidural. I have fully reviewed the prenatal and intrapartum course. Postpartum course has been WNL Baby's course has been WNL. Baby is feeding by botle. Bleeding no bleeding. Bowel function is normal. Bladder function is normal. Patient is not sexually active. . Contraception method is Depo-Provera injections. Postpartum depression screening: negative. Score 0.  Last pap 01/22/19 and was negative.  The following portions of the patient's history were reviewed and updated as appropriate: allergies, current medications, past medical history, past surgical history and problem list.  Review of Systems Pertinent items are noted in HPI.   There were no vitals filed for this visit. No LMP recorded.  Objective:   General:  alert, cooperative and no distress   Breasts:  deferred, no complaints  Lungs: clear to auscultation bilaterally  Heart:  regular rate and rhythm  Abdomen: soft, nontender   Vulva: normal  Vagina: normal vagina  Cervix:  closed  Corpus: Well-involuted  Adnexa:  Non-palpable  Rectal Exam: no hemorrhoids        Assessment:   Postpartum exam 6 wks s/p SVD Bottlefeeding Depression screening Contraception counseling   Plan:  : Depo-Provera injections, return as soon as able for first dose.  Follow up in: 6 months for annual or earlier if needed  Doreene Burke, [name]

## 2019-08-23 ENCOUNTER — Other Ambulatory Visit: Payer: Self-pay

## 2019-08-23 ENCOUNTER — Ambulatory Visit (INDEPENDENT_AMBULATORY_CARE_PROVIDER_SITE_OTHER): Payer: BC Managed Care – PPO | Admitting: Certified Nurse Midwife

## 2019-08-23 VITALS — BP 114/73 | HR 80 | Ht 66.0 in | Wt 274.2 lb

## 2019-08-23 DIAGNOSIS — Z3042 Encounter for surveillance of injectable contraceptive: Secondary | ICD-10-CM | POA: Diagnosis not present

## 2019-08-23 LAB — POCT URINE PREGNANCY: Preg Test, Ur: NEGATIVE

## 2019-08-23 MED ORDER — MEDROXYPROGESTERONE ACETATE 150 MG/ML IM SUSP
150.0000 mg | Freq: Once | INTRAMUSCULAR | Status: AC
  Administered 2019-08-23: 150 mg via INTRAMUSCULAR

## 2019-08-23 NOTE — Progress Notes (Signed)
Date last pap: 01/22/19. Last Depo-Provera: first initial injection  UPT NEG. Side Effects if any: n/a. Serum HCG indicated? n/a Depo-Provera 150 mg IM given by: Jari Pigg, LPN Next appointment due June 10-24, 2021.   BP 114/73   Pulse 80   Ht 5\' 6"  (1.676 m)   Wt 274 lb 3.2 oz (124.4 kg)   Breastfeeding No   BMI 44.26 kg/m

## 2019-11-14 ENCOUNTER — Ambulatory Visit: Payer: BC Managed Care – PPO

## 2019-11-15 ENCOUNTER — Ambulatory Visit: Payer: BC Managed Care – PPO

## 2019-11-19 ENCOUNTER — Ambulatory Visit: Payer: BC Managed Care – PPO

## 2019-11-22 ENCOUNTER — Ambulatory Visit (INDEPENDENT_AMBULATORY_CARE_PROVIDER_SITE_OTHER): Payer: BC Managed Care – PPO

## 2019-11-22 ENCOUNTER — Other Ambulatory Visit: Payer: Self-pay

## 2019-11-22 DIAGNOSIS — Z3042 Encounter for surveillance of injectable contraceptive: Secondary | ICD-10-CM | POA: Diagnosis not present

## 2019-11-22 MED ORDER — MEDROXYPROGESTERONE ACETATE 150 MG/ML IM SUSP
150.0000 mg | Freq: Once | INTRAMUSCULAR | Status: AC
  Administered 2019-11-22: 150 mg via INTRAMUSCULAR

## 2019-11-22 NOTE — Progress Notes (Signed)
Date last pap: 01/22/2019 Last Depo-Provera:  08/23/2019 Side Effects if any: prolonged bleeding and cramping.  Serum HCG indicated? n/a Depo-Provera 150 mg IM given by: Shawn Route, LPN Next appointment due Sept 9-23, 2021.

## 2020-02-20 ENCOUNTER — Other Ambulatory Visit (HOSPITAL_COMMUNITY)
Admission: RE | Admit: 2020-02-20 | Discharge: 2020-02-20 | Disposition: A | Discharge status: Home / Self Care | Payer: BC Managed Care – PPO | Source: Ambulatory Visit | Attending: Certified Nurse Midwife | Admitting: Certified Nurse Midwife

## 2020-02-20 ENCOUNTER — Other Ambulatory Visit: Payer: Self-pay

## 2020-02-20 ENCOUNTER — Ambulatory Visit (INDEPENDENT_AMBULATORY_CARE_PROVIDER_SITE_OTHER): Payer: BC Managed Care – PPO | Admitting: Certified Nurse Midwife

## 2020-02-20 ENCOUNTER — Encounter: Payer: Self-pay | Admitting: Certified Nurse Midwife

## 2020-02-20 VITALS — BP 108/70 | HR 86 | Ht 66.0 in | Wt 265.2 lb

## 2020-02-20 DIAGNOSIS — N898 Other specified noninflammatory disorders of vagina: Secondary | ICD-10-CM

## 2020-02-20 DIAGNOSIS — Z01419 Encounter for gynecological examination (general) (routine) without abnormal findings: Secondary | ICD-10-CM | POA: Diagnosis not present

## 2020-02-20 DIAGNOSIS — Z3042 Encounter for surveillance of injectable contraceptive: Secondary | ICD-10-CM | POA: Diagnosis not present

## 2020-02-20 DIAGNOSIS — R300 Dysuria: Secondary | ICD-10-CM | POA: Diagnosis not present

## 2020-02-20 DIAGNOSIS — Z1159 Encounter for screening for other viral diseases: Secondary | ICD-10-CM

## 2020-02-20 DIAGNOSIS — Z13220 Encounter for screening for lipoid disorders: Secondary | ICD-10-CM

## 2020-02-20 LAB — POCT URINALYSIS DIPSTICK
Bilirubin, UA: NEGATIVE
Blood, UA: NEGATIVE
Glucose, UA: NEGATIVE
Ketones, UA: NEGATIVE
Leukocytes, UA: NEGATIVE
Nitrite, UA: NEGATIVE
Protein, UA: NEGATIVE
Spec Grav, UA: >=1.03 — AB (ref 1.010–1.025)
Urobilinogen, UA: 0.2 E.U./dL
pH, UA: 6 (ref 5.0–8.0)

## 2020-02-20 MED ORDER — MEDROXYPROGESTERONE ACETATE 150 MG/ML IM SUSP: 150.0000 mg | Freq: Once | INTRAMUSCULAR | 3 refills | Status: DC

## 2020-02-20 MED ORDER — MEDROXYPROGESTERONE ACETATE 150 MG/ML IM SUSP
150.0000 mg | Freq: Once | INTRAMUSCULAR | Status: AC
  Administered 2020-02-20: 150 mg via INTRAMUSCULAR

## 2020-02-20 NOTE — Patient Instructions (Signed)
Preventive Care 21-26 Years Old, Female Preventive care refers to visits with your health care provider and lifestyle choices that can promote health and wellness. This includes:  A yearly physical exam. This may also be called an annual well check.  Regular dental visits and eye exams.  Immunizations.  Screening for certain conditions.  Healthy lifestyle choices, such as eating a healthy diet, getting regular exercise, not using drugs or products that contain nicotine and tobacco, and limiting alcohol use. What can I expect for my preventive care visit? Physical exam Your health care provider will check your:  Height and weight. This may be used to calculate body mass index (BMI), which tells if you are at a healthy weight.  Heart rate and blood pressure.  Skin for abnormal spots. Counseling Your health care provider may ask you questions about your:  Alcohol, tobacco, and drug use.  Emotional well-being.  Home and relationship well-being.  Sexual activity.  Eating habits.  Work and work environment.  Method of birth control.  Menstrual cycle.  Pregnancy history. What immunizations do I need?  Influenza (flu) vaccine  This is recommended every year. Tetanus, diphtheria, and pertussis (Tdap) vaccine  You may need a Td booster every 10 years. Varicella (chickenpox) vaccine  You may need this if you have not been vaccinated. Human papillomavirus (HPV) vaccine  If recommended by your health care provider, you may need three doses over 6 months. Measles, mumps, and rubella (MMR) vaccine  You may need at least one dose of MMR. You may also need a second dose. Meningococcal conjugate (MenACWY) vaccine  One dose is recommended if you are age 19-21 years and a first-year college student living in a residence hall, or if you have one of several medical conditions. You may also need additional booster doses. Pneumococcal conjugate (PCV13) vaccine  You may need  this if you have certain conditions and were not previously vaccinated. Pneumococcal polysaccharide (PPSV23) vaccine  You may need one or two doses if you smoke cigarettes or if you have certain conditions. Hepatitis A vaccine  You may need this if you have certain conditions or if you travel or work in places where you may be exposed to hepatitis A. Hepatitis B vaccine  You may need this if you have certain conditions or if you travel or work in places where you may be exposed to hepatitis B. Haemophilus influenzae type b (Hib) vaccine  You may need this if you have certain conditions. You may receive vaccines as individual doses or as more than one vaccine together in one shot (combination vaccines). Talk with your health care provider about the risks and benefits of combination vaccines. What tests do I need?  Blood tests  Lipid and cholesterol levels. These may be checked every 5 years starting at age 20.  Hepatitis C test.  Hepatitis B test. Screening  Diabetes screening. This is done by checking your blood sugar (glucose) after you have not eaten for a while (fasting).  Sexually transmitted disease (STD) testing.  BRCA-related cancer screening. This may be done if you have a family history of breast, ovarian, tubal, or peritoneal cancers.  Pelvic exam and Pap test. This may be done every 3 years starting at age 21. Starting at age 30, this may be done every 5 years if you have a Pap test in combination with an HPV test. Talk with your health care provider about your test results, treatment options, and if necessary, the need for more tests.   Follow these instructions at home: Eating and drinking   Eat a diet that includes fresh fruits and vegetables, whole grains, lean protein, and low-fat dairy.  Take vitamin and mineral supplements as recommended by your health care provider.  Do not drink alcohol if: ? Your health care provider tells you not to drink. ? You are  pregnant, may be pregnant, or are planning to become pregnant.  If you drink alcohol: ? Limit how much you have to 0-1 drink a day. ? Be aware of how much alcohol is in your drink. In the [place] one drink equals one 12 oz bottle of beer (355 mL), one 5 oz glass of wine (148 mL), or one 1 oz glass of hard liquor (44 mL). Lifestyle  Take daily care of your teeth and gums.  Stay active. Exercise for at least 30 minutes on 5 or more days each week.  Do not use any products that contain nicotine or tobacco, such as cigarettes, e-cigarettes, and chewing tobacco. If you need help quitting, ask your health care provider.  If you are sexually active, practice safe sex. Use a condom or other form of birth control (contraception) in order to prevent pregnancy and STIs (sexually transmitted infections). If you plan to become pregnant, see your health care provider for a preconception visit. What's next?  Visit your health care provider once a year for a well check visit.  Ask your health care provider how often you should have your eyes and teeth checked.  Stay up to date on all vaccines. This information is not intended to replace advice given to you by your health care provider. Make sure you discuss any questions you have with your health care provider. Document Revised: 01/26/2018 Document Reviewed: 01/26/2018 Elsevier Patient Education  2020 [employer].

## 2020-02-20 NOTE — Progress Notes (Signed)
GYNECOLOGY ANNUAL PREVENTATIVE CARE ENCOUNTER NOTE  History:     Cheryl Mills is a 24 y.o. [phone] female here for a routine annual gynecologic exam.  Current complaints: vaginal pain, irritation and swelling for the past month.She is concerned for STD and request vaginal swab testing.   Denies abnormal vaginal bleeding, discharge, pelvic pain, problems with intercourse or other gynecologic concerns.     Social Relationship:Single Living:with her kids Work: store Education officer, community Exercise: just what she get with work Smoke/Alcohol/drug [id number]   Gynecologic History Patient's last menstrual period was 08/19/2019 (approximate). Contraception: Depo-Provera injections Last Pap: 01/22/19. Results were: normal with negative HPV Last mammogram: n/a   Obstetric History OB History  Gravida Para Term Preterm AB Living  3 2 2   1 2   SAB TAB Ectopic Multiple Live Births  1     0 2    # Outcome Date GA Lbr Len/2nd Weight Sex Delivery Anes PTL Lv  3 Term 07/11/19 [redacted]w[redacted]d / 00:13 8 lb 8.2 oz (3.86 kg) F Vag-Spont EPI  LIV  2 SAB 2020 [redacted]w[redacted]d         1 Term 10/11/17 [redacted]w[redacted]d 07:13 / 00:53 8 lb 1.5 oz (3.67 kg) F Vag-Spont EPI  LIV    Past Medical History:  Diagnosis Date  . Herpes, genital    age 29y first and only breakfast  . Missed menses   . PCOS (polycystic ovarian syndrome)     Past Surgical History:  Procedure Laterality Date  . TYMPANOSTOMY TUBE PLACEMENT      Current Outpatient Medications on File Prior to Visit  Medication Sig Dispense Refill  . medroxyPROGESTERone (DEPO-PROVERA) 150 MG/ML injection Inject 1 mL (150 mg total) into the muscle once for 1 dose. 1 mL 3   No current facility-administered medications on file prior to visit.    No Known Allergies  Social History:  reports that she has quit smoking. She has never used smokeless tobacco. She reports that she does not drink alcohol and does not use drugs.  Family History  Problem Relation Age of  Onset  . Migraines Mother   . Hyperlipidemia Father   . Hypertension Father   . COPD Father   . Asthma Sister   . Cancer Sister        cervical  . Migraines Sister   . Thyroid disease Sister   . Cancer Paternal Grandmother        breast, had mastectomy  . Diabetes Paternal Grandmother   . Hyperlipidemia Paternal Grandmother   . Hypertension Paternal Grandmother   . Thyroid disease Paternal Grandmother     The following portions of the patient's history were reviewed and updated as appropriate: allergies, current medications, past family history, past medical history, past social history, past surgical history and problem list.  Review of Systems Pertinent items noted in HPI and remainder of comprehensive ROS otherwise negative.  Physical Exam:  BP 108/70   Pulse 86   Ht 5\' 6"  (1.676 m)   Wt 265 lb 3.2 oz (120.3 kg)   LMP 08/19/2019 (Approximate)   BMI 42.80 kg/m  CONSTITUTIONAL: Well-developed, well-nourished, over weight female in no acute distress.  HENT:  Normocephalic, atraumatic, External right and left ear normal. Oropharynx is clear and moist EYES: Conjunctivae and EOM are normal. Pupils are equal, round, and reactive to light. No scleral icterus.  NECK: Normal range of motion, supple, no masses.  Normal thyroid.  SKIN: Skin is warm and dry. No  rash noted. Not diaphoretic. No erythema. No pallor. MUSCULOSKELETAL: Normal range of motion. No tenderness.  No cyanosis, clubbing, or edema.  2+ distal pulses. NEUROLOGIC: Alert and oriented to person, place, and time. Normal reflexes, muscle tone coordination.  PSYCHIATRIC: Normal mood and affect. Normal behavior. Normal judgment and thought content. CARDIOVASCULAR: Normal heart rate noted, regular rhythm RESPIRATORY: Clear to auscultation bilaterally. Effort and breath sounds normal, no problems with respiration noted. BREASTS: Symmetric in size. No masses, tenderness, skin changes, nipple drainage, or lymphadenopathy  bilaterally.  ABDOMEN: Soft, no distention noted.  No tenderness, rebound or guarding.  PELVIC: external genitalia - mild irritation and redness notedand urethral meatus; mild redness of  vaginal mucosa and cervix.  No abnormal discharge noted.  Pap smear not indicated.  Swab collected.Normal uterine size, no other palpable masses, no uterine or adnexal tenderness.  .   Assessment and Plan:    1. Women's annual routine gynecological examination Pap: not due Mammogram : n/a Labs:Hep C, Lipid  Refills: Depo injection Referral:none Declines the flu  Routine preventative health maintenance measures emphasized. Please refer to After Visit Summary for other counseling recommendations.      Doreene Burke, [name] [facility],  [facility]

## 2020-02-20 NOTE — Progress Notes (Signed)
Depo provera given IM : 02/20/20  Pt tolerated well. Next due: 05/07/20- 05/21/20

## 2020-02-21 LAB — LIPID PANEL
Chol/HDL Ratio: 3.8 ratio (ref 0.0–4.4)
Cholesterol, Total: 157 mg/dL (ref 100–199)
HDL: 41 mg/dL (ref >39)
LDL Chol Calc (NIH): 102 mg/dL — ABNORMAL HIGH (ref 0–99)
Triglycerides: 71 mg/dL (ref 0–149)
VLDL Cholesterol Cal: 14 mg/dL (ref 5–40)

## 2020-02-21 LAB — HEPATITIS C ANTIBODY: Hep C Virus Ab: <0.1 s/co ratio (ref 0.0–0.9)

## 2020-02-22 ENCOUNTER — Ambulatory Visit: Payer: BC Managed Care – PPO

## 2020-02-22 LAB — CERVICOVAGINAL ANCILLARY ONLY
Bacterial Vaginitis (gardnerella): NEGATIVE
Candida Glabrata: NEGATIVE
Candida Vaginitis: NEGATIVE
Chlamydia: NEGATIVE
Comment: NEGATIVE
Comment: NEGATIVE
Comment: NEGATIVE
Comment: NEGATIVE
Comment: NEGATIVE
Comment: NORMAL
Neisseria Gonorrhea: NEGATIVE
Trichomonas: NEGATIVE

## 2020-05-01 ENCOUNTER — Ambulatory Visit
Admission: EM | Admit: 2020-05-01 | Discharge: 2020-05-01 | Disposition: A | Discharge status: Home / Self Care | Payer: BC Managed Care – PPO | Attending: Physician Assistant | Admitting: Physician Assistant

## 2020-05-01 ENCOUNTER — Encounter: Payer: Self-pay | Admitting: Emergency Medicine

## 2020-05-01 ENCOUNTER — Other Ambulatory Visit: Payer: Self-pay

## 2020-05-01 DIAGNOSIS — Z87891 Personal history of nicotine dependence: Secondary | ICD-10-CM | POA: Diagnosis not present

## 2020-05-01 DIAGNOSIS — R5383 Other fatigue: Secondary | ICD-10-CM | POA: Diagnosis not present

## 2020-05-01 DIAGNOSIS — Z20822 Contact with and (suspected) exposure to covid-19: Secondary | ICD-10-CM | POA: Diagnosis not present

## 2020-05-01 DIAGNOSIS — Z793 Long term (current) use of hormonal contraceptives: Secondary | ICD-10-CM | POA: Diagnosis not present

## 2020-05-01 DIAGNOSIS — J029 Acute pharyngitis, unspecified: Secondary | ICD-10-CM | POA: Diagnosis present

## 2020-05-01 DIAGNOSIS — J039 Acute tonsillitis, unspecified: Secondary | ICD-10-CM | POA: Diagnosis not present

## 2020-05-01 DIAGNOSIS — R519 Headache, unspecified: Secondary | ICD-10-CM | POA: Diagnosis not present

## 2020-05-01 LAB — RESP PANEL BY RT-PCR (RSV, FLU A&B, COVID)  RVPGX2
Influenza A by PCR: NEGATIVE
Influenza B by PCR: NEGATIVE
Resp Syncytial Virus by PCR: NEGATIVE
SARS Coronavirus 2 by RT PCR: NEGATIVE

## 2020-05-01 LAB — GROUP A STREP BY PCR: Group A Strep by PCR: NOT DETECTED

## 2020-05-01 MED ORDER — PENICILLIN V POTASSIUM 500 MG PO TABS: 500.0000 mg | ORAL_TABLET | Freq: Two times a day (BID) | ORAL | 0 refills | Status: AC

## 2020-05-01 NOTE — ED Provider Notes (Signed)
[facility]    CSN: [id number] Arrival date & time: 05/01/20  0737      History   Chief Complaint Chief Complaint  Patient presents with  . Sore Throat    HPI Cheryl Mills is a 24 y.o. female who presents with body aches and HA and fatigued 2 days ago. Last night started with ST and saw white spots on them. Felt she had a fever 2 days ago. Has had chills and sweats and could not check her temp since the thermometer is broke. Her daughter's have RSV and were negative for covid.  Has not had Covid injections.     Past Medical History:  Diagnosis Date  . 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 Missed menses   . PCOS (polycystic ovarian syndrome)     Patient Active Problem List   Diagnosis Date Noted  . Labor and delivery, indication for care 07/11/2019  . Obesity affecting pregnancy 01/22/2019  . BMI 40.0-44.9, adult (HCC) 03/30/2017  . Rh negative state in antepartum period 03/09/2017    Past Surgical History:  Procedure Laterality Date  . TYMPANOSTOMY TUBE PLACEMENT      OB History    Gravida  3   Para  2   Term  2   Preterm      AB  1   Living  2     SAB  1   TAB      Ectopic      Multiple  0   Live Births  2            Home Medications    Prior to Admission medications   Medication Sig Start Date End Date Taking? Authorizing Provider  medroxyPROGESTERone (DEPO-PROVERA) 150 MG/ML injection Inject 1 mL (150 mg total) into the muscle once for 1 dose. 02/20/20 05/01/20 Yes Doreene Burke, [name]    Family History Family History  Problem Relation Age of Onset  . Migraines Mother   . Hyperlipidemia Father   . Hypertension Father   . COPD Father   . Asthma Sister   . Cancer Sister        cervical  . Migraines Sister   . Thyroid disease Sister   . Cancer Paternal Grandmother        breast, had mastectomy  . Diabetes Paternal Grandmother   . Hyperlipidemia Paternal Grandmother   . Hypertension Paternal  Grandmother   . Thyroid disease Paternal Grandmother     Social History Social History   Tobacco Use  . Smoking status: Former Games developer  . Smokeless tobacco: Never Used  Vaping Use  . Vaping Use: Never used  Substance Use Topics  . Alcohol use: No  . Drug use: No     Allergies   Patient has no known allergies.   Review of Systems Review of Systems  Constitutional: Positive for chills and fever. Negative for appetite change, diaphoresis and fatigue.  HENT: Positive for postnasal drip and sore throat. Negative for congestion, ear discharge, ear pain, rhinorrhea and trouble swallowing.   Eyes: Negative for discharge.  Respiratory: Positive for cough. Negative for shortness of breath.   Gastrointestinal: Positive for nausea. Negative for abdominal pain, diarrhea and vomiting.  Genitourinary:       Has irregular periods due to Depo, was spotting last week  Musculoskeletal: Positive for myalgias.  Skin: Negative for rash.  Neurological: Positive for headaches.  Hematological: Negative for adenopathy.  Physical Exam Triage Vital Signs ED Triage Vitals  Enc Vitals Group     BP 05/01/20 0926 103/81     Pulse Rate 05/01/20 0926 84     Resp 05/01/20 0926 18     Temp 05/01/20 0926 97.9 F (36.6 C)     Temp Source 05/01/20 0926 Oral     SpO2 05/01/20 0926 100 %     Weight 05/01/20 0925 265 lb 3.4 oz (120.3 kg)     Height 05/01/20 0925 5\' 6"  (1.676 m)     Head Circumference --      Peak Flow --      Pain Score 05/01/20 0924 6     Pain Loc --      Pain Edu? --      Excl. in GC? --    No data found.  Updated Vital Signs BP 103/81 (BP Location: Left Arm)   Pulse 84   Temp 97.9 F (36.6 C) (Oral)   Resp 18   Ht 5\' 6"  (1.676 m)   Wt 265 lb 3.4 oz (120.3 kg)   SpO2 100%   Breastfeeding No   BMI 42.81 kg/m   Visual Acuity Right Eye Distance:   Left Eye Distance:   Bilateral Distance:    Right Eye Near:   Left Eye Near:    Bilateral Near:     Physical  Exam Constitutional:      General: She is not in acute distress.    Appearance: She is obese. She is not toxic-appearing.  HENT:     Right Ear: Tympanic membrane and ear canal normal.     Left Ear: Tympanic membrane and ear canal normal.     Mouth/Throat:     Mouth: Mucous membranes are moist.     Pharynx: Uvula midline. Posterior oropharyngeal erythema present. No oropharyngeal exudate.     Tonsils: Tonsillar exudate present. 2+ on the right. 2+ on the left.  Eyes:     Conjunctiva/sclera: Conjunctivae normal.     Pupils: Pupils are equal, round, and reactive to light.  Cardiovascular:     Rate and Rhythm: Normal rate and regular rhythm.     Heart sounds: Normal heart sounds. No murmur heard.   Pulmonary:     Effort: Pulmonary effort is normal.     Breath sounds: Normal breath sounds.  Musculoskeletal:     Cervical back: Normal range of motion.  Lymphadenopathy:     Cervical: Cervical adenopathy present.  Skin:    General: Skin is warm and dry.     Findings: No rash.  Neurological:     Mental Status: She is alert and oriented to person, place, and time.  Psychiatric:        Mood and Affect: Mood normal.        Behavior: Behavior normal.      UC Treatments / Results  Labs (all labs ordered are listed, but only abnormal results are displayed) Labs Reviewed  GROUP A STREP BY PCR  RESP PANEL BY RT-PCR (RSV, FLU A&B, COVID)  [id number]    EKG   Radiology No results found.  Procedures Procedures (including critical care time)  Medications Ordered in UC Medications - No data to display  Initial Impression / Assessment and Plan / UC Course  I have reviewed the triage vital signs and the nursing notes. Has exudative tonsillitis. I placed her on [facility] as noted.  Pertinent labs  results that were available during my care of the patient were  reviewed by me and considered in my medical decision making (see chart for details).  Final Clinical Impressions(s) / UC Diagnoses    Final diagnoses:  None   Discharge Instructions   None    ED Prescriptions    None     PDMP not reviewed this encounter.   Garey Ham, [place] 05/01/20 1948

## 2020-05-01 NOTE — ED Triage Notes (Signed)
Pt c/o sore throat, headache, nausea and subjective fever. Started about 2 days ago. Has not had covid vaccines. She states her daughters have had RSV.

## 2020-05-01 NOTE — Discharge Instructions (Signed)
Your test are all negative, but you may have a different strand of strep or virus causing the white spots on your tonsils. I will start you on penicillin to help get rid of this if it is another strand of strep causing the tonsillitis. Take it til finished. Of course, if it is viral, will not help and your body will rid of it. Gargle with warm salt water 2-3 times a day.

## 2020-05-13 NOTE — Progress Notes (Signed)
Last depo inj: 02/20/20 UPT:N/A Side effects:none Next Depo- Provera injection due: 07/31/20-08/14/20 Annual exam due:02/20/21

## 2020-05-15 ENCOUNTER — Other Ambulatory Visit: Payer: Self-pay

## 2020-05-15 ENCOUNTER — Ambulatory Visit (INDEPENDENT_AMBULATORY_CARE_PROVIDER_SITE_OTHER): Payer: BC Managed Care – PPO

## 2020-05-15 DIAGNOSIS — Z3042 Encounter for surveillance of injectable contraceptive: Secondary | ICD-10-CM | POA: Diagnosis not present

## 2020-05-15 MED ORDER — MEDROXYPROGESTERONE ACETATE 150 MG/ML IM SUSP
150.0000 mg | Freq: Once | INTRAMUSCULAR | Status: AC
  Administered 2020-05-15: 150 mg via INTRAMUSCULAR

## 2020-08-07 ENCOUNTER — Ambulatory Visit: Payer: BC Managed Care – PPO

## 2020-08-12 ENCOUNTER — Ambulatory Visit (INDEPENDENT_AMBULATORY_CARE_PROVIDER_SITE_OTHER): Payer: BC Managed Care – PPO

## 2020-08-12 ENCOUNTER — Other Ambulatory Visit: Payer: Self-pay

## 2020-08-12 ENCOUNTER — Ambulatory Visit: Payer: BC Managed Care – PPO

## 2020-08-12 DIAGNOSIS — Z3042 Encounter for surveillance of injectable contraceptive: Secondary | ICD-10-CM

## 2020-08-12 MED ORDER — MEDROXYPROGESTERONE ACETATE 150 MG/ML IM SUSP
150.0000 mg | Freq: Once | INTRAMUSCULAR | Status: AC
  Administered 2020-08-12: 150 mg via INTRAMUSCULAR

## 2020-08-12 NOTE — Progress Notes (Signed)
Date last pap: 01/22/2019. Last Depo-Provera: 05/15/2020. Side Effects if any: none Serum HCG indicated? n/a Depo-Provera 150 mg IM given by: FHampton, LPN. Next appointment due May 31-November 11, 2020.

## 2020-09-01 ENCOUNTER — Ambulatory Visit (INDEPENDENT_AMBULATORY_CARE_PROVIDER_SITE_OTHER): Payer: BC Managed Care – PPO | Admitting: Certified Nurse Midwife

## 2020-09-01 ENCOUNTER — Encounter: Payer: Self-pay | Admitting: Certified Nurse Midwife

## 2020-09-01 ENCOUNTER — Other Ambulatory Visit: Payer: Self-pay

## 2020-09-01 ENCOUNTER — Other Ambulatory Visit (HOSPITAL_COMMUNITY)
Admission: RE | Admit: 2020-09-01 | Discharge: 2020-09-01 | Disposition: A | Discharge status: Home / Self Care | Payer: BC Managed Care – PPO | Source: Ambulatory Visit | Attending: Certified Nurse Midwife | Admitting: Certified Nurse Midwife

## 2020-09-01 VITALS — BP 114/78 | HR 87 | Ht 66.0 in | Wt 253.3 lb

## 2020-09-01 DIAGNOSIS — N898 Other specified noninflammatory disorders of vagina: Secondary | ICD-10-CM | POA: Diagnosis present

## 2020-09-01 NOTE — Patient Instructions (Signed)
Vaginitis  Vaginitis is a condition in which the vaginal tissue swells and becomes irritated. This condition is most often caused by a change in the normal balance of bacteria and yeast that live in the vagina. This change causes an overgrowth of certain bacteria or yeast, which causes the inflammation. There are different types of vaginitis. What are the causes? The cause of this condition depends on the type of vaginitis. It can be caused by:  Bacteria (bacterial vaginosis).  Yeast, which is a fungus (candidiasis).  A parasite (trichomoniasis vaginitis).  A virus (viral vaginitis).  Low hormone levels (atrophic vaginitis). Low hormone levels can occur during pregnancy, breastfeeding, or after menopause.  Irritants, such as bubble baths, scented tampons, and feminine sprays (allergic vaginitis). Other factors can change the normal balance of the yeast and bacteria that live in the vagina. These include:  Antibiotic medicines.  Poor hygiene.  Diaphragms, vaginal sponges, spermicides, birth control pills, and intrauterine devices (IUDs).  Sex.  Infection.  Uncontrolled diabetes.  A weakened body defense system (immune system). What increases the risk? This condition is more likely to develop in women who:  Smoke or are exposed to secondhand smoke.  Use vaginal douches, scented tampons, or scented sanitary pads.  Wear tight-fitting pants or thong underwear.  Use oral birth control pills or an IUD.  Have sex without a condom or have multiple partners.  Have an STI.  Frequently use the spermicide nonoxynol-9.  Eat lots of foods high in sugar or who have uncontrolled diabetes.  Have low estrogen levels.  Have a weakened immune system from an immune disorder or medical treatment.  Are pregnant or breastfeeding. What are the signs or symptoms? Symptoms vary depending on the cause of the vaginitis. Common symptoms include:  Abnormal vaginal discharge. ? The  discharge is white, gray, or yellow with bacterial vaginosis. ? The discharge is thick, white, and cheesy with a yeast infection. ? The discharge is frothy and yellow or greenish with trichomoniasis.  A bad vaginal smell. The smell is fishy with bacterial vaginosis.  Vaginal itching, pain, or swelling.  Pain with sex.  Pain or burning when urinating. Sometimes there are no symptoms. How is this diagnosed? This condition is diagnosed based on your symptoms and medical history. A physical exam, including a pelvic exam, will also be done. You may also have other tests, including:  Tests to determine the pH level (acidity or alkalinity) of your vagina.  A whiff test to assess the odor that results when a sample of your vaginal discharge is mixed with a potassium hydroxide solution.  Tests of vaginal fluid. A sample will be examined under a microscope. How is this treated? Treatment varies depending on the type of vaginitis you have. Your treatment may include:  Antibiotic creams or pills to treat bacterial vaginosis and trichomoniasis.  Antifungal medicines, such as vaginal creams or suppositories, to treat a yeast infection.  Medicine to ease discomfort if you have viral vaginitis. Your sexual partner should also be treated.  Estrogen delivered in a cream, pill, suppository, or vaginal ring to treat atrophic vaginitis. If vaginal dryness occurs, lubricants and moisturizing creams may help. You may need to avoid scented soaps, sprays, or douches.  Stopping use of a product that is causing allergic vaginitis and then using a vaginal cream to treat the symptoms. Follow these instructions at home: Lifestyle  Keep your genital area clean and dry. Avoid soap, and only rinse the area with water.  Do not douche   or use tampons until your health care provider says it is okay. Use sanitary pads, if needed.  Do not have sex until your health care provider approves. When you can return to sex,  practice safe sex and use condoms.  Wipe from front to back. This avoids the spread of bacteria from the rectum to the vagina. General instructions  Take over-the-counter and prescription medicines only as told by your health care provider.  If you were prescribed an antibiotic medicine, take or use it as told by your health care provider. Do not stop taking or using the antibiotic even if you start to feel better.  Keep all follow-up visits. This is important. How is this prevented?  Use mild, unscented products. Do not use things that can irritate the vagina, such as fabric softeners. Avoid the following products if they are scented: ? Feminine sprays. ? Detergents. ? Tampons. ? Feminine hygiene products. ? Soaps or bubble baths.  Let air reach your genital area. To do this: ? Wear cotton underwear to reduce moisture buildup. ? Avoid wearing underwear while you sleep. ? Avoid wearing tight pants and underwear or nylons without a cotton panel. ? Avoid wearing thong underwear.  Take off any wet clothing, such as bathing suits, as soon as possible.  Practice safe sex and use condoms. Contact a health care provider if:  You have abdominal or pelvic pain.  You have a fever or chills.  You have symptoms that last for more than 2-3 days. Get help right away if:  You have a fever and your symptoms suddenly get worse. Summary  Vaginitis is a condition in which the vaginal tissue becomes inflamed.This condition is most often caused by a change in the normal balance of bacteria and yeast that live in the vagina.  Treatment varies depending on the type of vaginitis you have.  Do not douche, use tampons, or have sex until your health care provider approves. When you can return to sex, practice safe sex and use condoms. This information is not intended to replace advice given to you by your health care provider. Make sure you discuss any questions you have with your health care  provider. Document Revised: 11/15/2019 Document Reviewed: 11/15/2019 Elsevier Patient Education  2021 Elsevier Inc.  

## 2020-09-01 NOTE — Progress Notes (Signed)
GYN ENCOUNTER NOTE  Subjective:       Cheryl Mills is a 25 y.o. [id number] female is here for gynecologic evaluation of the following issues:  1. Vaginal odor on and off x 3 wks. She has not tried anything for it. She denies any new partner.     Gynecologic History No LMP recorded (lmp unknown). Patient has had an injection. Contraception: Depo-Provera injections Last Pap: 07/24/18. Results were: normal Last mammogram: n/a .   Obstetric History OB History  Gravida Para Term Preterm AB Living  3 2 2   1 2   SAB IAB Ectopic Multiple Live Births  1     0 2    # Outcome Date GA Lbr Len/2nd Weight Sex Delivery Anes PTL Lv  3 Term 07/11/19 [redacted]w[redacted]d / 00:13 8 lb 8.2 oz (3.86 kg) F Vag-Spont EPI  LIV  2 SAB 2020 [redacted]w[redacted]d         1 Term 10/11/17 [redacted]w[redacted]d 07:13 / 00:53 8 lb 1.5 oz (3.67 kg) F Vag-Spont EPI  LIV    Past Medical History:  Diagnosis Date  . 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 Missed menses   . PCOS (polycystic ovarian syndrome)     Past Surgical History:  Procedure Laterality Date  . TYMPANOSTOMY TUBE PLACEMENT      Current Outpatient Medications on File Prior to Visit  Medication Sig Dispense Refill  . medroxyPROGESTERone (DEPO-PROVERA) 150 MG/ML injection Inject 1 mL (150 mg total) into the muscle once for 1 dose. 1 mL 3   No current facility-administered medications on file prior to visit.    No Known Allergies  Social History   Socioeconomic History  . Marital status: Single    Spouse name: Not on file  . Number of children: Not on file  . Years of education: Not on file  . Highest education level: Not on file  Occupational History  . Not on file  Tobacco Use  . Smoking status: Former 12y  . Smokeless tobacco: Never Used  Vaping Use  . Vaping Use: Never used  Substance and Sexual Activity  . Alcohol use: No  . Drug use: No  . Sexual activity: Yes    Partners: Male    Birth control/protection: Injection    Comment: Depo  Other  Topics Concern  . Not on file  Social History Narrative  . Not on file   Social Determinants of Health   Financial Resource Strain: Not on file  Food Insecurity: Not on file  Transportation Needs: Not on file  Physical Activity: Not on file  Stress: Not on file  Social Connections: Not on file  Intimate Partner Violence: Not on file    Family History  Problem Relation Age of Onset  . Migraines Mother   . Hyperlipidemia Father   . Hypertension Father   . COPD Father   . Asthma Sister   . Cancer Sister        cervical  . Migraines Sister   . Thyroid disease Sister   . Cancer Paternal Grandmother        breast, had mastectomy  . Diabetes Paternal Grandmother   . Hyperlipidemia Paternal Grandmother   . Hypertension Paternal Grandmother   . Thyroid disease Paternal Grandmother     The following portions of the patient's history were reviewed and updated as appropriate: allergies, current medications, past family history, past medical history, past social history, past surgical history and problem  list.  Review of Systems Review of Systems - Negative except as mentioned in HPI Review of Systems - General ROS: negative for - chills, fatigue, fever, hot flashes, malaise or night sweats Hematological and Lymphatic ROS: negative for - bleeding problems or swollen lymph nodes Gastrointestinal ROS: negative for - abdominal pain, blood in stools, change in bowel habits and nausea/vomiting Musculoskeletal ROS: negative for - joint pain, muscle pain or muscular weakness Genito-Urinary ROS: negative for - change in menstrual cycle, dysmenorrhea, dyspareunia, dysuria, genital discharge, genital ulcers, hematuria, incontinence, irregular/heavy menses, nocturia or pelvic pain. Positive for vaginal odor   Objective:   BP 114/78   Pulse 87   Ht 5\' 6"  (1.676 m)   Wt 253 lb 4.8 oz (114.9 kg)   LMP  (LMP Unknown)   BMI 40.88 kg/m  CONSTITUTIONAL: Well-developed, well-nourished female in  no acute distress.  HENT:  Normocephalic, atraumatic.  NECK: Normal range of motion, supple, no masses.  Normal thyroid.  SKIN: Skin is warm and dry. No rash noted. Not diaphoretic. No erythema. No pallor. NEUROLGIC: Alert and oriented to person, place, and time. PSYCHIATRIC: Normal mood and affect. Normal behavior. Normal judgment and thought content. CARDIOVASCULAR:Not Examined RESPIRATORY: Not Examined BREASTS: Not Examined ABDOMEN: Soft, non distended; Non tender.  No Organomegaly. PELVIC:  External Genitalia: Normal  BUS: Normal  Vagina: Normal mild redness, white discharge   Cervix: Normalr MUSCULOSKELETAL: Normal range of motion. No tenderness.  No cyanosis, clubbing, or edema.     Assessment:   Vaginal odor    Plan:   Discussed use of Boric acid or replens . Swab collected will follow up with results.  Return Prn for annual exam or if symptoms do not improve.   , [name]

## 2020-09-01 NOTE — Progress Notes (Signed)
GYN-Pt present due to having vaginal odor x 3 weeks. Pt stated having strong vaginal odor. Pt denies any vaginal itching, burning, pain or discharge.

## 2020-09-02 LAB — CERVICOVAGINAL ANCILLARY ONLY
Bacterial Vaginitis (gardnerella): NEGATIVE
Candida Glabrata: NEGATIVE
Candida Vaginitis: NEGATIVE
Chlamydia: NEGATIVE
Comment: NEGATIVE
Comment: NEGATIVE
Comment: NEGATIVE
Comment: NEGATIVE
Comment: NEGATIVE
Comment: NORMAL
Neisseria Gonorrhea: NEGATIVE
Trichomonas: NEGATIVE

## 2020-11-03 ENCOUNTER — Other Ambulatory Visit: Payer: Self-pay

## 2020-11-03 ENCOUNTER — Ambulatory Visit: Payer: BC Managed Care – PPO

## 2020-11-03 ENCOUNTER — Ambulatory Visit (INDEPENDENT_AMBULATORY_CARE_PROVIDER_SITE_OTHER): Payer: BC Managed Care – PPO | Admitting: Certified Nurse Midwife

## 2020-11-03 DIAGNOSIS — Z3042 Encounter for surveillance of injectable contraceptive: Secondary | ICD-10-CM | POA: Diagnosis not present

## 2020-11-03 MED ORDER — MEDROXYPROGESTERONE ACETATE 150 MG/ML IM SUSP: 150.0000 mg | INTRAMUSCULAR | 0 refills | Status: DC

## 2020-11-03 MED ORDER — MEDROXYPROGESTERONE ACETATE 150 MG/ML IM SUSP
150.0000 mg | Freq: Once | INTRAMUSCULAR | Status: AC
  Administered 2020-11-03: 150 mg via INTRAMUSCULAR

## 2020-11-03 NOTE — Progress Notes (Signed)
Date last pap:n/a. Last Depo-Provera: 08/12/2020. Side Effects if any: decreased sex drive. Serum HCG indicated? n/a. Depo-Provera 150 mg IM given by: CM. Next appointment due 8/22-9/5. Next AE due after 9/23.

## 2020-12-22 ENCOUNTER — Telehealth: Payer: BC Managed Care – PPO | Admitting: Certified Nurse Midwife

## 2020-12-22 NOTE — Telephone Encounter (Signed)
Pt called stating that she is wanting to know if she needs to be seen in office or go to urgent care. Pt complains of bleeding 3x weeks now, passing quarter size clots frequently and filling a pad almost every hour. Pt states abdominal pain during the 1st week. Pt is starting to feel raw. Please Advise.

## 2020-12-22 NOTE — Telephone Encounter (Signed)
LVM with pt instructing to call back in regards to call placed by patient earlier this morning.

## 2021-01-28 ENCOUNTER — Encounter: Payer: Self-pay | Admitting: Certified Nurse Midwife

## 2021-01-28 ENCOUNTER — Other Ambulatory Visit: Payer: Self-pay

## 2021-01-28 ENCOUNTER — Ambulatory Visit (INDEPENDENT_AMBULATORY_CARE_PROVIDER_SITE_OTHER): Payer: BC Managed Care – PPO | Admitting: Certified Nurse Midwife

## 2021-01-28 VITALS — BP 107/74 | HR 80 | Ht 66.0 in | Wt 242.6 lb

## 2021-01-28 DIAGNOSIS — Z01419 Encounter for gynecological examination (general) (routine) without abnormal findings: Secondary | ICD-10-CM | POA: Diagnosis not present

## 2021-01-28 DIAGNOSIS — Z3042 Encounter for surveillance of injectable contraceptive: Secondary | ICD-10-CM

## 2021-01-28 MED ORDER — MEDROXYPROGESTERONE ACETATE 150 MG/ML IM SUSP: 150.0000 mg | INTRAMUSCULAR | 0 refills | Status: DC

## 2021-01-28 MED ORDER — MEDROXYPROGESTERONE ACETATE 150 MG/ML IM SUSP
150.0000 mg | Freq: Once | INTRAMUSCULAR | Status: AC
  Administered 2021-01-28: 150 mg via INTRAMUSCULAR

## 2021-01-28 MED ORDER — MEDROXYPROGESTERONE ACETATE 150 MG/ML IM SUSP: 150.0000 mg | Freq: Once | INTRAMUSCULAR | 3 refills | Status: DC

## 2021-01-28 NOTE — Progress Notes (Signed)
GYNECOLOGY ANNUAL PREVENTATIVE CARE ENCOUNTER NOTE  History:     Cheryl Mills is a 25 y.o. [id number] female here for a routine annual gynecologic exam.  Current complaints: none.   Denies abnormal vaginal bleeding, discharge, pelvic pain, problems with intercourse or other gynecologic concerns.     Social Relationship: Single Living: with her children Work: Programme researcher, broadcasting/film/video Exercise: none Smoke/Alcohol/drug use: vaping at work through work day. Denies drugs and alcohol use   Gynecologic History Patient's last menstrual period was 12/05/2020 (exact date). Contraception: Depo-Provera injections Last Pap: 07/24/2018. Results were: normal  Last mammogram: n/a   Upstream - 01/28/21 0908       Pregnancy Intention Screening   Does the patient want to become pregnant in the next year? No    Does the patient's partner want to become pregnant in the next year? N/A    Would the patient like to discuss contraceptive options today? N/A            The pregnancy intention screening data noted above was reviewed. Potential methods of contraception were discussed. The patient elected to proceed with Depo.   Obstetric History OB History  Gravida Para Term Preterm AB Living  3 2 2   1 2   SAB IAB Ectopic Multiple Live Births  1     0 2    # Outcome Date GA Lbr Len/2nd Weight Sex Delivery Anes PTL Lv  3 Term 07/11/19 [redacted]w[redacted]d / 00:13 8 lb 8.2 oz (3.86 kg) F Vag-Spont EPI  LIV  2 SAB 2020 [redacted]w[redacted]d         1 Term 10/11/17 [redacted]w[redacted]d 07:13 / 00:53 8 lb 1.5 oz (3.67 kg) F Vag-Spont EPI  LIV    Past Medical History:  Diagnosis Date   Herpes, genital    age 56y first and only breakfast   Missed menses    PCOS (polycystic ovarian syndrome)     Past Surgical History:  Procedure Laterality Date   TYMPANOSTOMY TUBE PLACEMENT      Current Outpatient Medications on File Prior to Visit  Medication Sig Dispense Refill   medroxyPROGESTERone (DEPO-PROVERA) 150 MG/ML injection Inject 1 mL  (150 mg total) into the muscle every 3 (three) months. 1 mL 0   medroxyPROGESTERone (DEPO-PROVERA) 150 MG/ML injection Inject 1 mL (150 mg total) into the muscle once for 1 dose. 1 mL 3   No current facility-administered medications on file prior to visit.    No Known Allergies  Social History:  reports that she has quit smoking. She has never used smokeless tobacco. She reports that she does not drink alcohol and does not use drugs.  Family History  Problem Relation Age of Onset   Migraines Mother    Hyperlipidemia Father    Hypertension Father    COPD Father    Asthma Sister    Cancer Sister        cervical   Migraines Sister    Thyroid disease Sister    Cancer Paternal Grandmother        breast, had mastectomy   Diabetes Paternal Grandmother    Hyperlipidemia Paternal Grandmother    Hypertension Paternal Grandmother    Thyroid disease Paternal Grandmother     The following portions of the patient's history were reviewed and updated as appropriate: allergies, current medications, past family history, past medical history, past social history, past surgical history and problem list.  Review of Systems Pertinent items noted in HPI and remainder of comprehensive  ROS otherwise negative.  Physical Exam:  BP 107/74   Pulse 80   Ht 5\' 6"  (1.676 m)   Wt 242 lb 9.6 oz (110 kg)   LMP 12/05/2020 (Exact Date)   BMI 39.16 kg/m  CONSTITUTIONAL: Well-developed, well-nourished , over weight female in no acute distress.  HENT:  Normocephalic, atraumatic, External right and left ear normal. Oropharynx is clear and moist EYES: Conjunctivae and EOM are normal. Pupils are equal, round, and reactive to light. No scleral icterus.  NECK: Normal range of motion, supple, no masses.  Normal thyroid.  SKIN: Skin is warm and dry. No rash noted. Not diaphoretic. No erythema. No pallor. MUSCULOSKELETAL: Normal range of motion. No tenderness.  No cyanosis, clubbing, or edema.  2+ distal  pulses. NEUROLOGIC: Alert and oriented to person, place, and time. Normal reflexes, muscle tone coordination.  PSYCHIATRIC: Normal mood and affect. Normal behavior. Normal judgment and thought content. CARDIOVASCULAR: Normal heart rate noted, regular rhythm RESPIRATORY: Clear to auscultation bilaterally. Effort and breath sounds normal, no problems with respiration noted. BREASTS: Symmetric in size. No masses, tenderness, skin changes, nipple drainage, or lymphadenopathy bilaterally.  ABDOMEN: Soft, no distention noted.  No tenderness, rebound or guarding.  PELVIC: Normal appearing external genitalia and urethral meatus; normal appearing vaginal mucosa and cervix.  No abnormal discharge noted.  Pap smear not due.  Normal uterine size, no other palpable masses, no uterine or adnexal tenderness.  .   Assessment and Plan:   Well woman exam with routine gynecological exam   Encounter for surveillance of injectable contraceptive  - medroxyPROGESTERone (DEPO-PROVERA) injection 150 mg  Pap: not due Mammogram : n/a  Labs: none Refills: Depo  Referral: none Routine preventative health maintenance measures emphasized. Please refer to After Visit Summary for other counseling recommendations.      02/05/2021, [name] [facility],  [facility]

## 2021-04-29 ENCOUNTER — Other Ambulatory Visit: Payer: Self-pay

## 2021-04-29 ENCOUNTER — Ambulatory Visit (INDEPENDENT_AMBULATORY_CARE_PROVIDER_SITE_OTHER): Payer: BC Managed Care – PPO | Admitting: Certified Nurse Midwife

## 2021-04-29 ENCOUNTER — Encounter: Payer: Self-pay | Admitting: Certified Nurse Midwife

## 2021-04-29 DIAGNOSIS — Z3042 Encounter for surveillance of injectable contraceptive: Secondary | ICD-10-CM | POA: Diagnosis not present

## 2021-04-29 MED ORDER — MEDROXYPROGESTERONE ACETATE 150 MG/ML IM SUSP
150.0000 mg | Freq: Once | INTRAMUSCULAR | Status: AC
  Administered 2021-04-29: 150 mg via INTRAMUSCULAR

## 2021-04-29 NOTE — Progress Notes (Signed)
Date last pap: 01/22/2019. Last Depo-Pro/vera: 01/28/2021. Side Effects if any: NA. Serum HCG indicated? NA. Depo-Provera 150 mg IM given by: Roddie Mc, CMA. Next appointment due 3 months from today.

## 2021-07-20 ENCOUNTER — Other Ambulatory Visit: Payer: Self-pay

## 2021-07-20 ENCOUNTER — Ambulatory Visit (INDEPENDENT_AMBULATORY_CARE_PROVIDER_SITE_OTHER): Payer: BC Managed Care – PPO | Admitting: Certified Nurse Midwife

## 2021-07-20 DIAGNOSIS — Z3042 Encounter for surveillance of injectable contraceptive: Secondary | ICD-10-CM | POA: Diagnosis not present

## 2021-07-20 MED ORDER — MEDROXYPROGESTERONE ACETATE 150 MG/ML IM SUSP
150.0000 mg | INTRAMUSCULAR | Status: AC
  Administered 2021-07-20 – 2021-12-28 (×2): 150 mg via INTRAMUSCULAR

## 2021-07-20 NOTE — Patient Instructions (Signed)
Medroxyprogesterone Injection (Contraception) °What is this medication? °MEDROXYPROGESTERONE (me DROX ee proe JES te rone) prevents ovulation and pregnancy. It belongs to a group of medications called contraceptives. This medication is a progestin hormone. °This medicine may be used for other purposes; ask your health care provider or pharmacist if you have questions. °COMMON BRAND NAME(S): Depo-Provera, Depo-subQ Provera 104 °What should I tell my care team before I take this medication? °They need to know if you have any of these conditions: °Asthma °Blood clots °Breast cancer or family history of breast cancer °Depression °Diabetes °Eating disorder (anorexia nervosa) °Heart attack °High blood pressure °HIV infection or AIDS °If you often drink alcohol °Kidney disease °Liver disease °Migraine headaches °Osteoporosis, weak bones °Seizures °Stroke °Tobacco smoker °Vaginal bleeding °An unusual or allergic reaction to medroxyprogesterone, other hormones, medications, foods, dyes, or preservatives °Pregnant or trying to get pregnant °Breast-feeding °How should I use this medication? °Depo-Provera CI contraceptive injection is given into a muscle. Depo-subQ Provera 104 injection is given under the skin. It is given in a hospital or clinic setting. The injection is usually given during the first 5 days after the start of a menstrual period or 6 weeks after delivery of a baby. °A patient package insert for the product will be given with each prescription and refill. Be sure to read this information carefully each time. The sheet may change often. °Talk to your care team about the use of this medication in children. Special care may be needed. These injections have been used in female children who have started having menstrual periods. °Overdosage: If you think you have taken too much of this medicine contact a poison control center or emergency room at once. °NOTE: This medicine is only for you. Do not share this medicine  with others. °What if I miss a dose? °Keep appointments for follow-up doses. You must get an injection once every 3 months. It is important not to miss your dose. Call your care team if you are unable to keep an appointment. °What may interact with this medication? °Antibiotics or medications for infections, especially rifampin and griseofulvin °Antivirals for HIV or hepatitis °Aprepitant °Armodafinil °Bexarotene °Bosentan °Medications for seizures like carbamazepine, felbamate, oxcarbazepine, phenytoin, phenobarbital, primidone, topiramate °Mitotane °Modafinil °St. John's wort °This list may not describe all possible interactions. Give your health care provider a list of all the medicines, herbs, non-prescription drugs, or dietary supplements you use. Also tell them if you smoke, drink alcohol, or use illegal drugs. Some items may interact with your medicine. °What should I watch for while using this medication? °This medication does not protect you against HIV infection (AIDS) or other sexually transmitted diseases. °Use of this product may cause you to lose calcium from your bones. Loss of calcium may cause weak bones (osteoporosis). Only use this product for more than 2 years if other forms of birth control are not right for you. The longer you use this product for birth control the more likely you will be at risk for weak bones. Ask your care team how you can keep strong bones. °You may have a change in bleeding pattern or irregular periods. Many females stop having periods while taking this medication. °If you have received your injections on time, your chance of being pregnant is very low. If you think you may be pregnant, see your care team as soon as possible. °Tell your care team if you want to get pregnant within the next year. The effect of this medication may last a   long time after you get your last injection. °What side effects may I notice from receiving this medication? °Side effects that you should  report to your care team as soon as possible: °Allergic reactions--skin rash, itching, hives, swelling of the face, lips, tongue, or throat °Blood clot--pain, swelling, or warmth in the leg, shortness of breath, chest pain °Gallbladder problems--severe stomach pain, nausea, vomiting, fever °Increase in blood pressure °Liver injury--right upper belly pain, loss of appetite, nausea, light-colored stool, dark yellow or brown urine, yellowing skin or eyes, unusual weakness or fatigue °New or worsening migraines or headaches °Seizures °Stroke--sudden numbness or weakness of the face, arm, or leg, trouble speaking, confusion, trouble walking, loss of balance or coordination, dizziness, severe headache, change in vision °Unusual vaginal discharge, itching, or odor °Worsening mood, feelings of depression °Side effects that usually do not require medical attention (report to your care team if they continue or are bothersome): °Breast pain or tenderness °Dark patches of the skin on the face or other sun-exposed areas °Irregular menstrual cycles or spotting °Nausea °Weight gain °This list may not describe all possible side effects. Call your doctor for medical advice about side effects. You may report side effects to FDA at 1-800-FDA-1088. °Where should I keep my medication? °This injection is only given by a care team. It will not be stored at home. °NOTE: This sheet is a summary. It may not cover all possible information. If you have questions about this medicine, talk to your doctor, pharmacist, or health care provider. °© 2022 Elsevier/Gold Standard (2020-07-20 00:00:00) ° °

## 2021-07-20 NOTE — Progress Notes (Signed)
Date last pap: 01/02/2019. Last Depo-Provera: 04/29/2021. Side Effects if any: None. Serum HCG indicated? N/A. Depo-Provera 150 mg IM given by: Cristy Folks, CMA. Next appointment due: May 8 - May 22.

## 2021-10-12 ENCOUNTER — Ambulatory Visit (INDEPENDENT_AMBULATORY_CARE_PROVIDER_SITE_OTHER): Payer: BC Managed Care – PPO | Admitting: Certified Nurse Midwife

## 2021-10-12 DIAGNOSIS — Z3042 Encounter for surveillance of injectable contraceptive: Secondary | ICD-10-CM

## 2021-10-12 MED ORDER — MEDROXYPROGESTERONE ACETATE 150 MG/ML IM SUSP
150.0000 mg | Freq: Once | INTRAMUSCULAR | Status: AC
  Administered 2021-10-12: 150 mg via INTRAMUSCULAR

## 2021-10-12 NOTE — Progress Notes (Signed)
Date last pap: 01/22/2019. ?Last Depo-Provera: 07/20/2021. ?Side Effects if any: n/a. ?Serum HCG indicated? N/a. ?Depo-Provera 150 mg IM given by: Beverely Pace CMA. ?Next appointment due 12/28/2021-01/11/2022.  ?

## 2021-12-28 ENCOUNTER — Ambulatory Visit (INDEPENDENT_AMBULATORY_CARE_PROVIDER_SITE_OTHER): Payer: BC Managed Care – PPO | Admitting: Certified Nurse Midwife

## 2021-12-28 VITALS — BP 100/67 | HR 76 | Resp 16 | Ht 66.0 in | Wt 269.2 lb

## 2021-12-28 DIAGNOSIS — Z3042 Encounter for surveillance of injectable contraceptive: Secondary | ICD-10-CM

## 2021-12-28 NOTE — Progress Notes (Signed)
Date last pap: 01/22/2019 Last Depo-Provera: 10/12/2021 Side Effects if any: She said she has no sex drive. Serum HCG indicated? N/a. Depo-Provera 150 mg IM given by: Santiago Bumpers, CMA Next appointment due: October 16 - October 30

## 2021-12-28 NOTE — Patient Instructions (Signed)
Medroxyprogesterone Injection (Contraception) ?What is this medication? ?MEDROXYPROGESTERONE (me DROX ee proe JES te rone) prevents ovulation and pregnancy. It belongs to a group of medications called contraceptives. This medication is a progestin hormone. ?This medicine may be used for other purposes; ask your health care provider or pharmacist if you have questions. ?COMMON BRAND NAME(S): Depo-Provera, Depo-subQ Provera 104 ?What should I tell my care team before I take this medication? ?They need to know if you have any of these conditions: ?Asthma ?Blood clots ?Breast cancer or family history of breast cancer ?Depression ?Diabetes ?Eating disorder (anorexia nervosa) ?Heart attack ?High blood pressure ?HIV infection or AIDS ?If you often drink alcohol ?Kidney disease ?Liver disease ?Migraine headaches ?Osteoporosis, weak bones ?Seizures ?Stroke ?Tobacco smoker ?Vaginal bleeding ?An unusual or allergic reaction to medroxyprogesterone, other hormones, medications, foods, dyes, or preservatives ?Pregnant or trying to get pregnant ?Breast-feeding ?How should I use this medication? ?Depo-Provera CI contraceptive injection is given into a muscle. Depo-subQ Provera 104 injection is given under the skin. It is given in a hospital or clinic setting. The injection is usually given during the first 5 days after the start of a menstrual period or 6 weeks after delivery of a baby. ?A patient package insert for the product will be given with each prescription and refill. Be sure to read this information carefully each time. The sheet may change often. ?Talk to your care team about the use of this medication in children. Special care may be needed. These injections have been used in female children who have started having menstrual periods. ?Overdosage: If you think you have taken too much of this medicine contact a poison control center or emergency room at once. ?NOTE: This medicine is only for you. Do not share this medicine  with others. ?What if I miss a dose? ?Keep appointments for follow-up doses. You must get an injection once every 3 months. It is important not to miss your dose. Call your care team if you are unable to keep an appointment. ?What may interact with this medication? ?Antibiotics or medications for infections, especially rifampin and griseofulvin ?Antivirals for HIV or hepatitis ?Aprepitant ?Armodafinil ?Bexarotene ?Bosentan ?Medications for seizures like carbamazepine, felbamate, oxcarbazepine, phenytoin, phenobarbital, primidone, topiramate ?Mitotane ?Modafinil ?St. John's wort ?This list may not describe all possible interactions. Give your health care provider a list of all the medicines, herbs, non-prescription drugs, or dietary supplements you use. Also tell them if you smoke, drink alcohol, or use illegal drugs. Some items may interact with your medicine. ?What should I watch for while using this medication? ?This medication does not protect you against HIV infection (AIDS) or other sexually transmitted diseases. ?Use of this product may cause you to lose calcium from your bones. Loss of calcium may cause weak bones (osteoporosis). Only use this product for more than 2 years if other forms of birth control are not right for you. The longer you use this product for birth control the more likely you will be at risk for weak bones. Ask your care team how you can keep strong bones. ?You may have a change in bleeding pattern or irregular periods. Many females stop having periods while taking this medication. ?If you have received your injections on time, your chance of being pregnant is very low. If you think you may be pregnant, see your care team as soon as possible. ?Tell your care team if you want to get pregnant within the next year. The effect of this medication may last a   long time after you get your last injection. ?What side effects may I notice from receiving this medication? ?Side effects that you should  report to your care team as soon as possible: ?Allergic reactions--skin rash, itching, hives, swelling of the face, lips, tongue, or throat ?Blood clot--pain, swelling, or warmth in the leg, shortness of breath, chest pain ?Gallbladder problems--severe stomach pain, nausea, vomiting, fever ?Increase in blood pressure ?Liver injury--right upper belly pain, loss of appetite, nausea, light-colored stool, dark yellow or brown urine, yellowing skin or eyes, unusual weakness or fatigue ?New or worsening migraines or headaches ?Seizures ?Stroke--sudden numbness or weakness of the face, arm, or leg, trouble speaking, confusion, trouble walking, loss of balance or coordination, dizziness, severe headache, change in vision ?Unusual vaginal discharge, itching, or odor ?Worsening mood, feelings of depression ?Side effects that usually do not require medical attention (report to your care team if they continue or are bothersome): ?Breast pain or tenderness ?Dark patches of the skin on the face or other sun-exposed areas ?Irregular menstrual cycles or spotting ?Nausea ?Weight gain ?This list may not describe all possible side effects. Call your doctor for medical advice about side effects. You may report side effects to FDA at 1-800-FDA-1088. ?Where should I keep my medication? ?This injection is only given by a care team. It will not be stored at home. ?NOTE: This sheet is a summary. It may not cover all possible information. If you have questions about this medicine, talk to your doctor, pharmacist, or health care provider. ?? 2023 Elsevier/Gold Standard (2020-07-20 00:00:00) ? ?

## 2022-03-12 ENCOUNTER — Encounter: Payer: Self-pay | Admitting: Certified Nurse Midwife

## 2022-03-15 ENCOUNTER — Encounter: Payer: Self-pay | Admitting: Certified Nurse Midwife

## 2022-03-21 ENCOUNTER — Other Ambulatory Visit: Payer: Self-pay | Admitting: Certified Nurse Midwife

## 2022-03-22 ENCOUNTER — Ambulatory Visit: Payer: BC Managed Care – PPO

## 2022-03-23 ENCOUNTER — Ambulatory Visit (INDEPENDENT_AMBULATORY_CARE_PROVIDER_SITE_OTHER): Payer: BC Managed Care – PPO

## 2022-03-23 VITALS — BP 119/79 | HR 78 | Ht 65.0 in | Wt 275.0 lb

## 2022-03-23 DIAGNOSIS — Z3042 Encounter for surveillance of injectable contraceptive: Secondary | ICD-10-CM | POA: Diagnosis not present

## 2022-03-23 MED ORDER — MEDROXYPROGESTERONE ACETATE 150 MG/ML IM SUSP
150.0000 mg | Freq: Once | INTRAMUSCULAR | Status: AC
  Administered 2022-03-23: 150 mg via INTRAMUSCULAR

## 2022-03-23 NOTE — Progress Notes (Signed)
Date last pap: [id number]. Last Depo-Provera: 12/28/2021. Side Effects if any: NA. Serum HCG indicated? NA. Depo-Provera 150 mg IM given by: Levert Feinstein CMA. Next appointment due JAN 9-JAN23.

## 2022-06-15 ENCOUNTER — Ambulatory Visit (INDEPENDENT_AMBULATORY_CARE_PROVIDER_SITE_OTHER): Payer: BC Managed Care – PPO

## 2022-06-15 VITALS — BP 110/79 | HR 85 | Ht 66.0 in | Wt 287.0 lb

## 2022-06-15 DIAGNOSIS — Z3042 Encounter for surveillance of injectable contraceptive: Secondary | ICD-10-CM | POA: Diagnosis not present

## 2022-06-15 DIAGNOSIS — Z3202 Encounter for pregnancy test, result negative: Secondary | ICD-10-CM

## 2022-06-15 LAB — POCT URINE PREGNANCY: Preg Test, Ur: NEGATIVE

## 2022-06-15 MED ORDER — MEDROXYPROGESTERONE ACETATE 150 MG/ML IM SUSP
150.0000 mg | Freq: Once | INTRAMUSCULAR | Status: AC
  Administered 2022-06-16: 150 mg via INTRAMUSCULAR

## 2022-06-15 NOTE — Progress Notes (Addendum)
    NURSE VISIT NOTE  Subjective:    Patient ID: Cheryl Mills, female    DOB: 12-Jul-1995, 27 y.o.   MRN: [id number]  HPI  Patient is a 27 y.o. [id number] female who presents for depo provera injection.[id number] female who presents for depo provera injection.   Objective:    BP 110/79   Pulse 85   Ht 5\' 6"  (1.676 m)   Wt 287 lb (130.2 kg)   BMI 46.32 kg/m   Last pap: 01/21/22. Last Depo-Provera: 12/28/2021. Side Effects if any: no sex drive side breast pain. Serum HCG indicated? Yes . Depo-Provera 150 mg IM given by: Levert Feinstein, CMA. Site: Right Deltoid   Assessment:   1. Encounter for surveillance of injectable contraceptive      Plan:   Next appointment due between April 3 -17     Landis Gandy, [place]

## 2022-09-08 ENCOUNTER — Ambulatory Visit (INDEPENDENT_AMBULATORY_CARE_PROVIDER_SITE_OTHER): Payer: BC Managed Care – PPO

## 2022-09-08 VITALS — BP 125/81 | HR 88 | Ht 66.0 in | Wt 287.0 lb

## 2022-09-08 DIAGNOSIS — Z3042 Encounter for surveillance of injectable contraceptive: Secondary | ICD-10-CM

## 2022-09-08 MED ORDER — MEDROXYPROGESTERONE ACETATE 150 MG/ML IM SUSP
150.0000 mg | Freq: Once | INTRAMUSCULAR | Status: AC
  Administered 2022-09-08: 150 mg via INTRAMUSCULAR

## 2022-09-08 NOTE — Patient Instructions (Signed)
Contraceptive Injection A contraceptive injection is a shot that prevents pregnancy. It is also called a birth control shot. The shot contains the hormone progestin, which prevents pregnancy by: Stopping the ovaries from releasing eggs. Thickening cervical mucus to prevent sperm from entering the cervix. Thinning the lining of the uterus to prevent a fertilized egg from attaching to the uterus. Contraceptive injections are given under the skin (subcutaneous) or into a muscle (intramuscular). For these shots to work, you must get one of them every 3 months (12-13 weeks) from a health care provider. Tell a health care provider about: Any allergies you have. All medicines you are taking, including vitamins, herbs, eye drops, creams, and over-the-counter medicines. Any blood disorders you have. Any medical conditions you have. Whether you are pregnant or may be pregnant. What are the risks? Generally, this is a safe procedure. However, problems may occur, including: Mood changes or depression. Loss of bone density (osteoporosis) after long-term use. Blood clots. This is rare. Higher risk of an egg being fertilized outside your uterus (ectopic pregnancy).This is rare. What happens before the procedure? Your health care provider may do a routine physical exam. You may have a test to make sure you are not pregnant. What happens during the procedure?  The area where the shot will be given will be cleaned and sanitized with alcohol. A needle will be inserted into a muscle in your upper arm or buttock, or into the skin of your thigh or abdomen. The needle will be attached to a syringe with the medicine inside of it. The medicine will be pushed through the syringe and injected into your body. A small bandage (dressing) may be placed over the injection site. What can I expect after the procedure? After the procedure, it is common to have: Soreness around the injection site for a couple of  days. Irregular menstrual bleeding. Weight gain. Breast tenderness. Headaches. Discomfort in your abdomen. Ask your health care provider whether you need to use an added method of birth control (backup contraception), such as a condom, sponge, or spermicide. If the first shot is given 1-7 days after the start of your last menstrual period, you will not need backup contraception. If the first shot is given at any other time during your menstrual cycle, you should avoid having sex. If you do have sex, you will need to use backup contraception for 7 days after you receive the shot. Follow these instructions at home: General instructions Take over-the-counter and prescription medicines only as told by your health care provider. Do not rub or massage the injection site. Track your menstrual periods so you will know if they become irregular. Always use a condom to protect against sexually transmitted infections (STIs). Make sure you schedule an appointment in time for your next shot and mark it on your calendar. You must get an injection every 3 months (12-13 weeks) to prevent pregnancy. Lifestyle Do not use any products that contain nicotine or tobacco. These products include cigarettes, chewing tobacco, and vaping devices, such as e-cigarettes. If you need help quitting, ask your health care provider. Eat foods that are high in calcium and vitamin D, such as milk, cheese, and salmon. Doing this may help with any loss in bone density caused by the contraceptive injection. Ask your health care provider for dietary recommendations. Contact a health care provider if you: Have nausea or vomiting. Have abnormal vaginal discharge or bleeding. Miss a menstrual period or think you might be pregnant. Experience mood changes   or depression. Feel dizzy or light-headed. Have leg pain. Get help right away if you: Have chest pain or cough up blood. Have shortness of breath. Have a severe headache that does  not go away. Have numbness in any part of your body. Have slurred speech or vision problems. Have vaginal bleeding that is abnormally heavy or does not stop, or you have severe pain in your abdomen. Have depression that does not get better with treatment. If you ever feel like you may hurt yourself or others, or have thoughts about taking your own life, get help right away. Go to your nearest emergency department or: Call your local emergency services (911 in the [place] Call a suicide crisis helpline, such as the National Suicide Prevention Lifeline at 1-800-273-8255 or 988 in the [place] This is open 24 hours a day in the [place] Text the Crisis Text Line at 741741 (in the [place] Summary A contraceptive injection is a shot that prevents pregnancy. It is also called the birth control shot. The shot is given under the skin (subcutaneous) or into a muscle (intramuscular). After this procedure, it is common to have soreness around the injection site for a couple of days. To prevent pregnancy, the shot must be given by a health care provider every 3 months (12-13 weeks). After you have the shot, ask your health care provider whether you need to use an added method of birth control (backup contraception), such as a condom, sponge, or spermicide. This information is not intended to replace advice given to you by your health care provider. Make sure you discuss any questions you have with your health care provider. Document Revised: 12/10/2020 Document Reviewed: 11/26/2019 Elsevier Patient Education  2023 Elsevier Inc.  

## 2022-09-08 NOTE — Progress Notes (Signed)
    NURSE VISIT NOTE  Subjective:    Patient ID: Cheryl Mills, female    DOB: 27-Nov-1995, 27 y.o.   MRN: [id number]  HPI  Patient is a 27 y.o. [id number] female who presents for depo provera injection.   Objective:    Ht 5\' 6"  (1.676 m)   Wt 287 lb (130.2 kg)   BMI 46.32 kg/m   Last Annual: 04/29/21. Last pap: 01/22/19. Last Depo-Provera: 06/15/22. Side Effects if any: none. Serum HCG indicated? No . Depo-Provera 150 mg IM given by: Cornelius Moras, CMA. Site: Left Deltoid  Lab Review  @THIS  VISIT ONLY@  Assessment:   No diagnosis found.   Plan:   Next appointment due between June 26 and July 9.    Cornelius Moras, CMA

## 2022-12-01 ENCOUNTER — Ambulatory Visit (INDEPENDENT_AMBULATORY_CARE_PROVIDER_SITE_OTHER): Payer: BC Managed Care – PPO

## 2022-12-01 VITALS — BP 111/79 | HR 88 | Ht 66.0 in | Wt 291.0 lb

## 2022-12-01 DIAGNOSIS — Z3042 Encounter for surveillance of injectable contraceptive: Secondary | ICD-10-CM

## 2022-12-01 MED ORDER — MEDROXYPROGESTERONE ACETATE 150 MG/ML IM SUSY
150.0000 mg | PREFILLED_SYRINGE | Freq: Once | INTRAMUSCULAR | Status: AC
  Administered 2022-12-01: 150 mg via INTRAMUSCULAR

## 2022-12-01 NOTE — Patient Instructions (Signed)

## 2022-12-01 NOTE — Progress Notes (Signed)
    NURSE VISIT NOTE  Subjective:    Patient ID: Cheryl Mills, female    DOB: March 02, 1996, 27 y.o.   MRN: [id number]  HPI  Patient is a 27 y.o. [id number] female who presents for depo provera injection.   Objective:    BP 111/79   Pulse 88   Ht 5\' 6"  (1.676 m)   Wt 291 lb (132 kg)   BMI 46.97 kg/m   Last Annual: 01/28/2021. Last pap: 01/22/2019. Last Depo-Provera: 09/08/2022. Side Effects if any: none. Serum HCG indicated? No . Depo-Provera 150 mg IM given by: Donnetta Hail, CMA. Site: Right Deltoid   Assessment:   1. Encounter for surveillance of injectable contraceptive      Plan:   Patient has been advised to schedule annual. Next appointment due between Sept 18 and Oct 2.    Donnetta Hail, CMA

## 2022-12-08 ENCOUNTER — Ambulatory Visit (INDEPENDENT_AMBULATORY_CARE_PROVIDER_SITE_OTHER): Payer: BC Managed Care – PPO | Admitting: Certified Nurse Midwife

## 2022-12-08 ENCOUNTER — Encounter: Payer: Self-pay | Admitting: Certified Nurse Midwife

## 2022-12-08 ENCOUNTER — Other Ambulatory Visit (HOSPITAL_COMMUNITY)
Admission: RE | Admit: 2022-12-08 | Discharge: 2022-12-08 | Disposition: A | Discharge status: Home / Self Care | Payer: BC Managed Care – PPO | Source: Ambulatory Visit | Attending: Certified Nurse Midwife | Admitting: Certified Nurse Midwife

## 2022-12-08 VITALS — BP 108/74 | HR 82 | Ht 66.0 in | Wt 292.1 lb

## 2022-12-08 DIAGNOSIS — Z01419 Encounter for gynecological examination (general) (routine) without abnormal findings: Secondary | ICD-10-CM | POA: Insufficient documentation

## 2022-12-08 DIAGNOSIS — Z124 Encounter for screening for malignant neoplasm of cervix: Secondary | ICD-10-CM | POA: Insufficient documentation

## 2022-12-08 MED ORDER — MEDROXYPROGESTERONE ACETATE 150 MG/ML IM SUSP: 150.0000 mg | INTRAMUSCULAR | 3 refills | Status: DC

## 2022-12-08 NOTE — Progress Notes (Signed)
GYNECOLOGY ANNUAL PREVENTATIVE CARE ENCOUNTER NOTE  History:     Cheryl Mills is a 27 y.o. G1P2012 female here for a routine annual gynecologic exam.  Current complaints: weight gain, state she has changed jobs and is sitting at desk all day.   Denies abnormal vaginal bleeding, discharge, pelvic pain, problems with intercourse or other gynecologic concerns.     Social Relationship: single  Living: with children Work: Programme researcher, broadcasting/film/video  Exercise:  none  Smoke/Alcohol/drug use: vapes  Gynecologic History No LMP recorded. Patient has had an injection. Contraception: Depo-Provera injections Last Pap: 01/22/2019. Results were: normal  Last mammogram: n/a .   Upstream - 12/08/22 0845       Pregnancy Intention Screening   Does the patient want to become pregnant in the next year? No    Does the patient's partner want to become pregnant in the next year? No    Would the patient like to discuss contraceptive options today? No      Contraception Wrap Up   Current Method Hormonal Injection    End Method Hormonal Injection            The pregnancy intention screening data noted above was reviewed. Potential methods of contraception were discussed. The patient elected to proceed with Hormonal Injection.  Obstetric History OB History  Gravida Para Term Preterm AB Living  3 2 2   1 2   SAB IAB Ectopic Multiple Live Births  1     0 2    # Outcome Date GA Lbr Len/2nd Weight Sex Delivery Anes PTL Lv  3 Term 07/11/19 [redacted]w[redacted]d / 00:13 8 lb 8.2 oz (3.86 kg) F Vag-Spont EPI  LIV  2 SAB 2020 [redacted]w[redacted]d         1 Term 10/11/17 [redacted]w[redacted]d 07:13 / 00:53 8 lb 1.5 oz (3.67 kg) F Vag-Spont EPI  LIV    Past Medical History:  Diagnosis Date   Herpes, genital    age 89y first and only breakfast   Missed menses    PCOS (polycystic ovarian syndrome)     Past Surgical History:  Procedure Laterality Date   TYMPANOSTOMY TUBE PLACEMENT      No current outpatient medications on file  prior to visit.   No current facility-administered medications on file prior to visit.    No Known Allergies  Social History:  reports that she has quit smoking. She has never used smokeless tobacco. She reports that she does not drink alcohol and does not use drugs.  Family History  Problem Relation Age of Onset   Migraines Mother    Hyperlipidemia Father    Hypertension Father    COPD Father    Asthma Sister    Cancer Sister        cervical   Migraines Sister    Thyroid disease Sister    Cancer Paternal Grandmother        breast, had mastectomy   Diabetes Paternal Grandmother    Hyperlipidemia Paternal Grandmother    Hypertension Paternal Grandmother    Thyroid disease Paternal Grandmother     The following portions of the patient's history were reviewed and updated as appropriate: allergies, current medications, past family history, past medical history, past social history, past surgical history and problem list.  Review of Systems Pertinent items noted in HPI and remainder of comprehensive ROS otherwise negative.  Physical Exam:  BP 108/74   Pulse 82   Ht 5\' 6"  (1.676  m)   Wt 292 lb 1.6 oz (132.5 kg)   BMI 47.15 kg/m  CONSTITUTIONAL: Well-developed, well-nourished female in no acute distress.  HENT:  Normocephalic, atraumatic, External right and left ear normal. Oropharynx is clear and moist EYES: Conjunctivae and EOM are normal. Pupils are equal, round, and reactive to light. No scleral icterus.  NECK: Normal range of motion, supple, no masses.  Normal thyroid.  SKIN: Skin is warm and dry. No rash noted. Not diaphoretic. No erythema. No pallor. MUSCULOSKELETAL: Normal range of motion. No tenderness.  No cyanosis, clubbing, or edema.  2+ distal pulses. NEUROLOGIC: Alert and oriented to person, place, and time. Normal reflexes, muscle tone coordination.  PSYCHIATRIC: Normal mood and affect. Normal behavior. Normal judgment and thought content. CARDIOVASCULAR:  Normal heart rate noted, regular rhythm RESPIRATORY: Clear to auscultation bilaterally. Effort and breath sounds normal, no problems with respiration noted. BREASTS: Symmetric in size. No masses, tenderness, skin changes, nipple drainage, or lymphadenopathy bilaterally.  ABDOMEN: Soft, no distention noted.  No tenderness, rebound or guarding.  PELVIC: Normal appearing external genitalia and urethral meatus; normal appearing vaginal mucosa and cervix.  No abnormal discharge noted.  Pap smear obtained.  Uterine size- difficult to assess, no other palpable masses, no uterine or adnexal tenderness.  Difficult to assess due to body habitus.    Assessment and Plan:    1. Women's annual routine gynecological examination  Pap: Will follow up results of pap smear and manage accordingly. Mammogram : n/a  Labs: none  Refills: Depo injection Referral:  none   Encouraged regular exercise Routine preventative health maintenance measures emphasized. Please refer to After Visit Summary for other counseling recommendations.      Doreene Burke, [name] [facility]  [facility],  [facility]

## 2022-12-08 NOTE — Patient Instructions (Signed)

## 2022-12-14 LAB — CYTOLOGY - PAP: Diagnosis: NEGATIVE

## 2023-02-23 ENCOUNTER — Ambulatory Visit (INDEPENDENT_AMBULATORY_CARE_PROVIDER_SITE_OTHER): Payer: BC Managed Care – PPO

## 2023-02-23 VITALS — BP 102/75 | HR 85 | Resp 16 | Ht 65.5 in | Wt 293.6 lb

## 2023-02-23 DIAGNOSIS — Z3042 Encounter for surveillance of injectable contraceptive: Secondary | ICD-10-CM | POA: Diagnosis not present

## 2023-02-23 MED ORDER — MEDROXYPROGESTERONE ACETATE 150 MG/ML IM SUSP
150.0000 mg | Freq: Once | INTRAMUSCULAR | Status: AC
  Administered 2023-02-23: 150 mg via INTRAMUSCULAR

## 2023-02-23 NOTE — Progress Notes (Signed)
NURSE VISIT NOTE  Subjective:    Patient ID: Cheryl Mills, female    DOB: 11-03-1995, 27 y.o.   MRN: [id number]  HPI  Patient is a 27 y.o. G4W1027 female who presents for depo provera injection.   Objective:    BP 102/75   Pulse 85   Resp 16   Ht 5' 5.5" (1.664 m)   Wt 293 lb 9.6 oz (133.2 kg)   BMI 48.11 kg/m   Last Annual: 12/08/2022. Last pap: 12/08/2022. Last Depo-Provera: 12/01/2022. Side Effects if any: She reports low sex drive. Serum HCG indicated? No . Depo-Provera 150 mg IM given by: Santiago Bumpers, CMA. Site: Left Deltoid  Lab Review  @THIS  VISIT ONLY@  Assessment:   1. Encounter for surveillance of injectable contraceptive      Plan:   Next appointment due between December 11 and December 25.    Santiago Bumpers, CMA [facility]

## 2023-02-23 NOTE — Patient Instructions (Signed)

## 2023-05-17 ENCOUNTER — Ambulatory Visit (INDEPENDENT_AMBULATORY_CARE_PROVIDER_SITE_OTHER): Payer: BC Managed Care – PPO

## 2023-05-17 VITALS — BP 108/78 | HR 76 | Ht 66.0 in | Wt 289.6 lb

## 2023-05-17 DIAGNOSIS — Z3042 Encounter for surveillance of injectable contraceptive: Secondary | ICD-10-CM

## 2023-05-17 MED ORDER — MEDROXYPROGESTERONE ACETATE 150 MG/ML IM SUSY
150.0000 mg | PREFILLED_SYRINGE | Freq: Once | INTRAMUSCULAR | Status: AC
  Administered 2023-05-17: 150 mg via INTRAMUSCULAR

## 2023-05-17 NOTE — Progress Notes (Signed)
    NURSE VISIT NOTE  Subjective:    Patient ID: Cheryl Mills, female    DOB: 05/29/1996, 27 y.o.   MRN: [id number]  HPI  Patient is a 27 y.o. W2N5621 female who presents for depo provera injection.   Objective:    BP 108/78   Pulse 76   Wt 289 lb 9.6 oz (131.4 kg)   BMI 47.46 kg/m   Last Annual: 12/08/22. Last pap: 12/08/22. Last Depo-Provera: 02/23/23. Side Effects if any: none. Serum HCG indicated? No . Depo-Provera 150 mg IM given by: Beverely Pace, CMA. Site: Right Deltoid   Assessment:   1. Encounter for management and injection of depo-Provera      Plan:   Next appointment due between March 4-18   Loney Laurence, [place]

## 2023-05-18 ENCOUNTER — Ambulatory Visit: Payer: BC Managed Care – PPO

## 2023-08-09 ENCOUNTER — Ambulatory Visit: Payer: BC Managed Care – PPO

## 2023-08-11 ENCOUNTER — Ambulatory Visit: Payer: BC Managed Care – PPO

## 2023-08-11 VITALS — BP 114/85 | HR 81 | Ht 66.0 in | Wt 283.0 lb

## 2023-08-11 DIAGNOSIS — Z3042 Encounter for surveillance of injectable contraceptive: Secondary | ICD-10-CM

## 2023-08-11 MED ORDER — MEDROXYPROGESTERONE ACETATE 150 MG/ML IM SUSY
150.0000 mg | PREFILLED_SYRINGE | Freq: Once | INTRAMUSCULAR | Status: AC
  Administered 2023-08-11: 150 mg via INTRAMUSCULAR

## 2023-08-11 NOTE — Progress Notes (Signed)
    NURSE VISIT NOTE  Subjective:    Patient ID: SCARLETH BRAME, female    DOB: 09-14-1995, 28 y.o.   MRN: [id number]  HPI  Patient is a 28 y.o. W0J8119 female who presents for depo provera injection.   Objective:    BP 114/85   Pulse 81   Wt 283 lb (128.4 kg)   BMI 45.68 kg/m   Last Annual: 12/08/22. Last pap: 12/08/22. Last Depo-Provera: 05/17/23. Side Effects if any: none. Serum HCG indicated? No . Depo-Provera 150 mg IM given by: Beverely Pace, CMA. Site: Right Deltoid    Assessment:   1. Encounter for Depo-Provera contraception      Plan:   Next appointment due between May 29-Jun 12    Loney Laurence, CMA

## 2023-11-02 NOTE — Progress Notes (Unsigned)
    NURSE VISIT NOTE  Subjective:    Patient ID: Cheryl Mills, female    DOB: [birth year], 28 y.o.   MRN: [id number]  HPI  Patient is a 28 y.o. G73P2012 female who presents for depo provera  injection.   Objective:    There were no vitals taken for this visit.  Last Annual: ***. Last pap: ***. Last Depo-Provera : ***. Side Effects if any: {NONE:21772}***. Serum HCG indicated? {YES/NO:21197}. Depo-Provera  150 mg IM given by: {AOB Clinical JYNWG:95621}. Site: {AOB INJ [id number]  Lab Review  No results found for any visits on 11/03/23.  Assessment:   No diagnosis found.   Plan:   Next appointment due between *** and ***.    Cheryl Mills H Sirenia Whitis, CMA

## 2023-11-03 ENCOUNTER — Ambulatory Visit (INDEPENDENT_AMBULATORY_CARE_PROVIDER_SITE_OTHER)

## 2023-11-03 VITALS — BP 116/80 | HR 77 | Ht 66.0 in | Wt 293.0 lb

## 2023-11-03 DIAGNOSIS — Z3042 Encounter for surveillance of injectable contraceptive: Secondary | ICD-10-CM

## 2023-11-03 MED ORDER — MEDROXYPROGESTERONE ACETATE 150 MG/ML IM SUSY
150.0000 mg | PREFILLED_SYRINGE | Freq: Once | INTRAMUSCULAR | Status: AC
  Administered 2023-11-03: 150 mg via INTRAMUSCULAR

## 2024-01-26 ENCOUNTER — Ambulatory Visit

## 2024-01-26 VITALS — BP 117/71 | HR 71 | Wt 269.2 lb

## 2024-01-26 DIAGNOSIS — Z3042 Encounter for surveillance of injectable contraceptive: Secondary | ICD-10-CM | POA: Diagnosis not present

## 2024-01-26 MED ORDER — MEDROXYPROGESTERONE ACETATE 150 MG/ML IM SUSP
150.0000 mg | Freq: Once | INTRAMUSCULAR | Status: AC
  Administered 2024-01-26: 150 mg via INTRAMUSCULAR

## 2024-01-26 NOTE — Progress Notes (Signed)
    NURSE VISIT NOTE  Subjective:    Patient ID: Cheryl Mills, female    DOB: Apr 08, 1996, 28 y.o.   MRN: [id number]  HPI  Patient is a 28 y.o. G52P2012 female who presents for depo provera  injection.   Objective:    BP 117/71   Pulse 71   Wt 269 lb 3.2 oz (122.1 kg)   BMI 43.45 kg/m   Last Annual: 12/08/2022. Last pap: 12/08/2022. Last Depo-Provera : 11/03/2023. Side Effects if any: none. Serum HCG indicated? No . Depo-Provera  150 mg IM given by: Rollo Louder, RN. Site: Right Deltoid  Lab Review  No results found for any visits on 01/26/24.  Assessment:   1. Encounter for Depo-Provera  contraception      Plan:   Patient should return for annual and depo shot between Nov 13 and Nov 27.    Rollo FORBES Louder, RN

## 2024-03-05 ENCOUNTER — Ambulatory Visit: Admitting: Certified Nurse Midwife

## 2024-03-15 NOTE — Patient Instructions (Signed)

## 2024-03-15 NOTE — Progress Notes (Signed)
 GYNECOLOGY ANNUAL PREVENTATIVE CARE ENCOUNTER NOTE  History:     Cheryl Mills is a 28 y.o. [phone] female here for a routine annual gynecologic exam.  Current complaints: none.   Denies abnormal vaginal bleeding, discharge, pelvic pain, problems with intercourse or other gynecologic concerns.     Social Relationship:single Living: with children Work:[employer] Exercise:walking  Smoke/Alcohol/drug ldz:cjez, alcohol sometimes, no drug use  Gynecologic History No LMP recorded. Patient has had an injection. Contraception: Depo-Provera  injections Last Pap: 12/08/22. Results were: normal with negative HPV Last mammogram: n/a.   Obstetric History OB History  Gravida Para Term Preterm AB Living  3 2 2  1 2   SAB IAB Ectopic Multiple Live Births  1   0 2    # Outcome Date GA Lbr Len/2nd Weight Sex Type Anes PTL Lv  3 Term 07/11/19 [redacted]w[redacted]d / 00:13 8 lb 8.2 oz (3.86 kg) F Vag-Spont EPI  LIV  2 SAB 2020 [redacted]w[redacted]d         1 Term 10/11/17 [redacted]w[redacted]d 07:13 / 00:53 8 lb 1.5 oz (3.67 kg) F Vag-Spont EPI  LIV    Past Medical History:  Diagnosis Date   Herpes, genital    age 53y first and only breakfast   Missed menses    PCOS (polycystic ovarian syndrome)     Past Surgical History:  Procedure Laterality Date   TYMPANOSTOMY TUBE PLACEMENT      No current outpatient medications on file prior to visit.   No current facility-administered medications on file prior to visit.    No Known Allergies  Social History:  reports that she has quit smoking. She has never used smokeless tobacco. She reports that she does not drink alcohol and does not use drugs.  Family History  Problem Relation Age of Onset   Migraines Mother    Hyperlipidemia Father    Hypertension Father    COPD Father    Asthma Sister    Cancer Sister        cervical   Migraines Sister    Thyroid disease Sister    Cancer Paternal Grandmother        breast, had mastectomy   Diabetes Paternal Grandmother     Hyperlipidemia Paternal Grandmother    Hypertension Paternal Grandmother    Thyroid disease Paternal Grandmother     The following portions of the patient's history were reviewed and updated as appropriate: allergies, current medications, past family history, past medical history, past social history, past surgical history and problem list.  Review of Systems Pertinent items noted in HPI and remainder of comprehensive ROS otherwise negative.  Physical Exam:  BP 125/78   Pulse 75   Wt 256 lb (116.1 kg)   BMI 41.32 kg/m  CONSTITUTIONAL: Well-developed, well-nourished, over weight female in no acute distress.  HENT:  Normocephalic, atraumatic, External right and left ear normal. Oropharynx is clear and moist EYES: Conjunctivae and EOM are normal. Pupils are equal, round, and reactive to light. No scleral icterus.  NECK: Normal range of motion, supple, no masses.  Normal thyroid.  SKIN: Skin is warm and dry. No rash noted. Not diaphoretic. No erythema. No pallor. MUSCULOSKELETAL: Normal range of motion. No tenderness.  No cyanosis, clubbing, or edema.  2+ distal pulses. NEUROLOGIC: Alert and oriented to person, place, and time. Normal reflexes, muscle tone coordination.  PSYCHIATRIC: Normal mood and affect. Normal behavior. Normal judgment and thought content. CARDIOVASCULAR: Normal heart rate noted, regular rhythm RESPIRATORY:  Clear to auscultation bilaterally. Effort and breath sounds normal, no problems with respiration noted. BREASTS: Symmetric in size. No masses, tenderness, skin changes, nipple drainage, or lymphadenopathy bilaterally.  ABDOMEN: Soft, no distention noted.  No tenderness, rebound or guarding.  PELVIC: Normal appearing external genitalia and urethral meatus; normal appearing vaginal mucosa and cervix.  No abnormal discharge noted.  Pap smear not due.  Normal uterine size, no other palpable masses, no uterine or adnexal tenderness.  .   Assessment and Plan:    Annual  Well Women GYN Exam   Pap: not due  Mammogram : not due  Labs: none  Refills:depo provera  Referral: none  Routine preventative health maintenance measures emphasized. Please refer to After Visit Summary for other counseling recommendations.      Zelda Hummer, [name] [facility]  [facility],  [facility]

## 2024-03-16 ENCOUNTER — Ambulatory Visit (INDEPENDENT_AMBULATORY_CARE_PROVIDER_SITE_OTHER): Admitting: Certified Nurse Midwife

## 2024-03-16 ENCOUNTER — Encounter: Payer: Self-pay | Admitting: Certified Nurse Midwife

## 2024-03-16 VITALS — BP 125/78 | HR 75 | Wt 256.0 lb

## 2024-03-16 DIAGNOSIS — Z01419 Encounter for gynecological examination (general) (routine) without abnormal findings: Secondary | ICD-10-CM | POA: Diagnosis not present

## 2024-03-16 MED ORDER — MEDROXYPROGESTERONE ACETATE 150 MG/ML IM SUSP: 150.0000 mg | INTRAMUSCULAR | 3 refills | Status: AC

## 2024-04-19 ENCOUNTER — Ambulatory Visit

## 2024-04-19 VITALS — BP 110/75 | HR 97 | Wt 253.5 lb

## 2024-04-19 DIAGNOSIS — Z3042 Encounter for surveillance of injectable contraceptive: Secondary | ICD-10-CM | POA: Diagnosis not present

## 2024-04-19 MED ORDER — MEDROXYPROGESTERONE ACETATE 150 MG/ML IM SUSP
150.0000 mg | Freq: Once | INTRAMUSCULAR | Status: AC
  Administered 2024-04-19: 150 mg via INTRAMUSCULAR

## 2024-04-19 NOTE — Progress Notes (Signed)
    NURSE VISIT NOTE  Subjective:    Patient ID: Cheryl Mills, female    DOB: [birth year], 28 y.o.   MRN: [id number]  HPI  Patient is a 28 y.o. G44P2012 female who presents for depo provera  injection.   Objective:    BP 110/75   Pulse 97   Wt 253 lb 8 oz (115 kg)   LMP  (LMP Unknown)   BMI 40.92 kg/m   Last Annual: 12/08/22. Last pap: 12/08/22. Last Depo-Provera : 01/26/24. Side Effects if any: none. Serum HCG indicated? No . Depo-Provera  150 mg IM given by: Mathis Getting, CMA. Site: Left Deltoid  Lab Review  No results found for any visits on 04/19/24.  Assessment:   1. Encounter for management and injection of depo-Provera       Plan:   Next appointment due between 07/05/24 and 07/19/24.    Mathis LITTIE Getting, CMA

## 2024-04-19 NOTE — Patient Instructions (Signed)

## 2024-07-12 ENCOUNTER — Ambulatory Visit
